# Patient Record
Sex: Male | Born: 1985
Health system: Southern US, Community
[De-identification: ages and names within clinical notes are randomized; demographics above are authoritative.]

## PROBLEM LIST (undated history)

## (undated) DIAGNOSIS — I10 Essential (primary) hypertension: Secondary | ICD-10-CM

## (undated) DIAGNOSIS — R400 Somnolence: Secondary | ICD-10-CM

## (undated) DIAGNOSIS — G4730 Sleep apnea, unspecified: Secondary | ICD-10-CM

## (undated) DIAGNOSIS — F419 Anxiety disorder, unspecified: Secondary | ICD-10-CM

## (undated) DIAGNOSIS — H9192 Unspecified hearing loss, left ear: Secondary | ICD-10-CM

## (undated) HISTORY — PX: TONSILLECTOMY: SUR1361

## (undated) HISTORY — DX: Sleep apnea, unspecified: G47.30

## (undated) HISTORY — DX: Unspecified hearing loss, left ear: H91.92

## (undated) HISTORY — DX: Somnolence: R40.0

## (undated) HISTORY — DX: Anxiety disorder, unspecified: F41.9

## (undated) HISTORY — DX: Essential (primary) hypertension: I10

---

## 2016-03-29 DIAGNOSIS — Z23 Encounter for immunization: Secondary | ICD-10-CM | POA: Diagnosis not present

## 2016-04-08 DIAGNOSIS — F1520 Other stimulant dependence, uncomplicated: Secondary | ICD-10-CM | POA: Diagnosis not present

## 2016-04-08 DIAGNOSIS — Z23 Encounter for immunization: Secondary | ICD-10-CM | POA: Diagnosis not present

## 2016-04-08 DIAGNOSIS — R5383 Other fatigue: Secondary | ICD-10-CM | POA: Diagnosis not present

## 2016-04-08 DIAGNOSIS — I10 Essential (primary) hypertension: Secondary | ICD-10-CM | POA: Diagnosis not present

## 2016-04-08 DIAGNOSIS — G4710 Hypersomnia, unspecified: Secondary | ICD-10-CM | POA: Diagnosis not present

## 2016-04-12 MED FILL — LISINOPRIL 5 MG TABLET: 5 | 90 days supply | Qty: 90 | Fill #0

## 2016-05-21 DIAGNOSIS — I10 Essential (primary) hypertension: Secondary | ICD-10-CM | POA: Diagnosis not present

## 2016-05-21 DIAGNOSIS — E782 Mixed hyperlipidemia: Secondary | ICD-10-CM | POA: Diagnosis not present

## 2016-06-02 DIAGNOSIS — Z23 Encounter for immunization: Secondary | ICD-10-CM | POA: Diagnosis not present

## 2016-06-02 DIAGNOSIS — Z6834 Body mass index (BMI) 34.0-34.9, adult: Secondary | ICD-10-CM | POA: Diagnosis not present

## 2016-06-02 DIAGNOSIS — E782 Mixed hyperlipidemia: Secondary | ICD-10-CM | POA: Diagnosis not present

## 2016-06-02 DIAGNOSIS — I10 Essential (primary) hypertension: Secondary | ICD-10-CM | POA: Diagnosis not present

## 2016-06-02 DIAGNOSIS — E663 Overweight: Secondary | ICD-10-CM | POA: Diagnosis not present

## 2016-06-07 MED FILL — LISINOPRIL 5 MG TABLET: 5 | 30 days supply | Qty: 90 | Fill #0

## 2016-07-01 DIAGNOSIS — G4726 Circadian rhythm sleep disorder, shift work type: Secondary | ICD-10-CM | POA: Diagnosis not present

## 2016-07-01 DIAGNOSIS — Z0000 Encounter for general adult medical examination without abnormal findings: Secondary | ICD-10-CM | POA: Diagnosis not present

## 2016-07-01 DIAGNOSIS — I10 Essential (primary) hypertension: Secondary | ICD-10-CM | POA: Diagnosis not present

## 2016-07-01 MED FILL — MODAFINIL 200 MG TABLET: 200 | 30 days supply | Qty: 30 | Fill #0

## 2016-07-06 MED FILL — LISINOPRIL 5 MG TABLET: 5 | 30 days supply | Qty: 90 | Fill #1

## 2016-07-29 MED FILL — MODAFINIL 200 MG TABLET: 200 | 30 days supply | Qty: 30 | Fill #0

## 2016-08-06 MED FILL — LISINOPRIL 5 MG TABLET: 5 | 30 days supply | Qty: 90 | Fill #2

## 2016-08-20 DIAGNOSIS — Z7189 Other specified counseling: Secondary | ICD-10-CM | POA: Diagnosis not present

## 2016-08-20 DIAGNOSIS — H938X2 Other specified disorders of left ear: Secondary | ICD-10-CM | POA: Diagnosis not present

## 2016-08-20 MED FILL — DOXYCYCLINE HYCLATE 100 MG: 100 | 14 days supply | Qty: 14 | Fill #0

## 2016-08-20 MED FILL — predniSONE 10 MG TABS: 10 | 7 days supply | Qty: 28 | Fill #0

## 2016-09-07 MED FILL — MODAFINIL 200 MG TABLET: 200 | 30 days supply | Qty: 30 | Fill #1

## 2016-09-07 MED FILL — LISINOPRIL 5 MG TABLET: 5 | 30 days supply | Qty: 90 | Fill #3

## 2016-10-06 DIAGNOSIS — H9042 Sensorineural hearing loss, unilateral, left ear, with unrestricted hearing on the contralateral side: Secondary | ICD-10-CM | POA: Diagnosis not present

## 2016-10-06 DIAGNOSIS — H903 Sensorineural hearing loss, bilateral: Secondary | ICD-10-CM | POA: Insufficient documentation

## 2016-10-06 DIAGNOSIS — H9312 Tinnitus, left ear: Secondary | ICD-10-CM | POA: Diagnosis not present

## 2016-10-06 DIAGNOSIS — H9313 Tinnitus, bilateral: Secondary | ICD-10-CM | POA: Insufficient documentation

## 2016-10-11 MED FILL — LISINOPRIL 10 MG TABLET: 10 | 90 days supply | Qty: 135 | Fill #0

## 2016-10-12 MED FILL — MODAFINIL 200 MG TAB: 200 | 30 days supply | Qty: 30 | Fill #2

## 2016-10-20 DIAGNOSIS — Z0000 Encounter for general adult medical examination without abnormal findings: Secondary | ICD-10-CM | POA: Diagnosis not present

## 2016-10-20 DIAGNOSIS — I10 Essential (primary) hypertension: Secondary | ICD-10-CM | POA: Diagnosis not present

## 2016-10-27 DIAGNOSIS — M40292 Other kyphosis, cervical region: Secondary | ICD-10-CM | POA: Diagnosis not present

## 2016-10-27 DIAGNOSIS — E559 Vitamin D deficiency, unspecified: Secondary | ICD-10-CM | POA: Diagnosis not present

## 2016-10-27 DIAGNOSIS — M40209 Unspecified kyphosis, site unspecified: Secondary | ICD-10-CM | POA: Diagnosis not present

## 2016-10-27 DIAGNOSIS — M40294 Other kyphosis, thoracic region: Secondary | ICD-10-CM | POA: Diagnosis not present

## 2016-10-27 DIAGNOSIS — Z0000 Encounter for general adult medical examination without abnormal findings: Secondary | ICD-10-CM | POA: Diagnosis not present

## 2016-11-09 DIAGNOSIS — M545 Low back pain: Secondary | ICD-10-CM | POA: Diagnosis not present

## 2016-11-17 MED FILL — MODAFINIL 200 MG TAB: 200 | 30 days supply | Qty: 30 | Fill #3

## 2016-11-30 DIAGNOSIS — H9042 Sensorineural hearing loss, unilateral, left ear, with unrestricted hearing on the contralateral side: Secondary | ICD-10-CM | POA: Diagnosis not present

## 2016-12-31 MED FILL — ARMODAFINIL 150 MG TABLET: 150 | 30 days supply | Qty: 30 | Fill #0

## 2017-01-11 MED FILL — LISINOPRIL 10 MG TABLET: 10 | 90 days supply | Qty: 135 | Fill #1

## 2017-01-26 DIAGNOSIS — E559 Vitamin D deficiency, unspecified: Secondary | ICD-10-CM | POA: Diagnosis not present

## 2017-01-31 MED FILL — ARMODAFINIL 150 MG TABLET: 150 | 30 days supply | Qty: 30 | Fill #1

## 2017-03-07 MED FILL — ARMODAFINIL 150 MG TABLET: 150 | 30 days supply | Qty: 30 | Fill #2

## 2017-04-08 MED FILL — ARMODAFINIL 150 MG TABLET: 150 | 30 days supply | Qty: 30 | Fill #3

## 2017-04-20 MED FILL — LISINOPRIL 10 MG TABS: 10 | 90 days supply | Qty: 135 | Fill #0

## 2017-05-12 MED FILL — ARMODAFINIL 150 MG TABLET: 150 | 30 days supply | Qty: 30 | Fill #0

## 2017-05-20 DIAGNOSIS — F10129 Alcohol abuse with intoxication, unspecified: Secondary | ICD-10-CM | POA: Diagnosis not present

## 2017-05-20 DIAGNOSIS — R4182 Altered mental status, unspecified: Secondary | ICD-10-CM | POA: Diagnosis present

## 2017-05-20 DIAGNOSIS — F10120 Alcohol abuse with intoxication, uncomplicated: Secondary | ICD-10-CM | POA: Insufficient documentation

## 2017-05-20 DIAGNOSIS — R740 Nonspecific elevation of levels of transaminase and lactic acid dehydrogenase [LDH]: Secondary | ICD-10-CM | POA: Diagnosis not present

## 2017-05-20 DIAGNOSIS — Z79899 Other long term (current) drug therapy: Secondary | ICD-10-CM | POA: Insufficient documentation

## 2017-05-20 DIAGNOSIS — R1110 Vomiting, unspecified: Secondary | ICD-10-CM | POA: Diagnosis not present

## 2017-05-20 MED ORDER — SODIUM CHLORIDE 0.9 % IV BOLUS (SEPSIS)
1000.0000 mL | Freq: Once | INTRAVENOUS | Status: AC
  Administered 2017-05-21: 1000 mL via INTRAVENOUS

## 2017-05-21 ENCOUNTER — Encounter (HOSPITAL_COMMUNITY): Payer: Self-pay | Admitting: Emergency Medicine

## 2017-05-21 ENCOUNTER — Other Ambulatory Visit: Payer: Self-pay

## 2017-05-21 ENCOUNTER — Emergency Department (HOSPITAL_COMMUNITY)
Admission: EM | Admit: 2017-05-21 | Discharge: 2017-05-21 | Disposition: A | Discharge status: Home / Self Care | Payer: 59 | Attending: Emergency Medicine | Admitting: Emergency Medicine

## 2017-05-21 ENCOUNTER — Emergency Department (HOSPITAL_COMMUNITY): Payer: 59

## 2017-05-21 DIAGNOSIS — F10920 Alcohol use, unspecified with intoxication, uncomplicated: Secondary | ICD-10-CM

## 2017-05-21 DIAGNOSIS — Z79899 Other long term (current) drug therapy: Secondary | ICD-10-CM | POA: Diagnosis not present

## 2017-05-21 DIAGNOSIS — R740 Nonspecific elevation of levels of transaminase and lactic acid dehydrogenase [LDH]: Secondary | ICD-10-CM

## 2017-05-21 DIAGNOSIS — R7989 Other specified abnormal findings of blood chemistry: Secondary | ICD-10-CM | POA: Diagnosis not present

## 2017-05-21 DIAGNOSIS — F10120 Alcohol abuse with intoxication, uncomplicated: Secondary | ICD-10-CM | POA: Diagnosis not present

## 2017-05-21 DIAGNOSIS — R7401 Elevation of levels of liver transaminase levels: Secondary | ICD-10-CM

## 2017-05-21 LAB — COMPREHENSIVE METABOLIC PANEL
ALK PHOS: 39 U/L (ref 38–126)
ALT: 90 U/L — ABNORMAL HIGH (ref 17–63)
ANION GAP: 13 (ref 5–15)
AST: 282 U/L — ABNORMAL HIGH (ref 15–41)
Albumin: 4.7 g/dL (ref 3.5–5.0)
BUN: 14 mg/dL (ref 6–20)
CALCIUM: 9 mg/dL (ref 8.9–10.3)
CO2: 19 mmol/L — AB (ref 22–32)
Chloride: 106 mmol/L (ref 101–111)
Creatinine, Ser: 0.92 mg/dL (ref 0.61–1.24)
GFR calc non Af Amer: >60 mL/min (ref >60)
Glucose, Bld: 121 mg/dL — ABNORMAL HIGH (ref 65–99)
Potassium: 3.2 mmol/L — ABNORMAL LOW (ref 3.5–5.1)
SODIUM: 138 mmol/L (ref 135–145)
Total Bilirubin: 0.5 mg/dL (ref 0.3–1.2)
Total Protein: 7.5 g/dL (ref 6.5–8.1)

## 2017-05-21 LAB — CBG MONITORING, ED: GLUCOSE-CAPILLARY: 114 mg/dL — AB (ref 65–99)

## 2017-05-21 LAB — CBC
HCT: 44.6 % (ref 39.0–52.0)
HEMOGLOBIN: 15.5 g/dL (ref 13.0–17.0)
MCH: 29.7 pg (ref 26.0–34.0)
MCHC: 34.8 g/dL (ref 30.0–36.0)
MCV: 85.4 fL (ref 78.0–100.0)
Platelets: 370 10*3/uL (ref 150–400)
RBC: 5.22 MIL/uL (ref 4.22–5.81)
RDW: 12.5 % (ref 11.5–15.5)
WBC: 17.6 10*3/uL — ABNORMAL HIGH (ref 4.0–10.5)

## 2017-05-21 LAB — LIPASE, BLOOD: Lipase: 35 U/L (ref 11–51)

## 2017-05-21 LAB — ETHANOL: ALCOHOL ETHYL (B): 216 mg/dL — AB (ref <10)

## 2017-05-21 MED ORDER — NAPROXEN 500 MG PO TABS
500.0000 mg | ORAL_TABLET | Freq: Once | ORAL | Status: AC
  Administered 2017-05-21: 500 mg via ORAL
  Filled 2017-05-21: qty 1

## 2017-05-21 MED ORDER — SODIUM CHLORIDE 0.9 % IV BOLUS (SEPSIS)
1000.0000 mL | Freq: Once | INTRAVENOUS | Status: AC
  Administered 2017-05-21: 1000 mL via INTRAVENOUS

## 2017-05-21 MED ORDER — POTASSIUM CHLORIDE 10 MEQ/100ML IV SOLN: 10.0000 meq | INTRAVENOUS | Status: AC

## 2017-05-21 MED ORDER — PROMETHAZINE HCL 25 MG/ML IJ SOLN
25.0000 mg | Freq: Once | INTRAMUSCULAR | Status: AC
  Administered 2017-05-21: 25 mg via INTRAVENOUS
  Filled 2017-05-21: qty 1

## 2017-05-21 MED ORDER — ONDANSETRON HCL 4 MG/2ML IJ SOLN
4.0000 mg | Freq: Once | INTRAMUSCULAR | Status: AC
  Administered 2017-05-21: 4 mg via INTRAVENOUS
  Filled 2017-05-21: qty 2

## 2017-05-21 NOTE — ED Notes (Signed)
Bed: WTR9 Expected date:  Expected time:  Means of arrival:  Comments: 

## 2017-05-21 NOTE — ED Notes (Signed)
Bed: [facility]ALG Expected date:  Expected time:  Means of arrival:  Comments: 

## 2017-05-21 NOTE — Discharge Instructions (Signed)
1. Medications: usual home medications 2. Treatment: rest, drink plenty of fluids,  3. Follow Up: Please followup with your primary doctor in 2-3 days for discussion of your diagnoses and further evaluation after today's visit; if you do not have a primary care doctor use the resource guide provided to find one; Please return to the ER for abdominal pain, vomiting, jaundice or other concerns

## 2017-05-21 NOTE — ED Notes (Signed)
Removed PIV #20 from right hand. 

## 2017-05-21 NOTE — ED Provider Notes (Signed)
[facility]-EMERGENCY DEPT Provider Note   CSN: [id number] Arrival date & time: 05/20/17  2350     History   Chief Complaint Chief Complaint  Patient presents with  . Alcohol Intoxication    HPI Jay Ortiz is a 32 y.o. male with a hx of hard of hearing presents to the Emergency Department after being found in the bathroom at a party.  Friend at bedside reports patient had large amount of alcohol to drink tonight.  Denies any known drug use.  Level 5 caveat for altered mental status.  The history is provided by medical records and a friend. No language interpreter was used.    History reviewed. No pertinent past medical history.  There are no active problems to display for this patient.     Home Medications    Prior to Admission medications   Medication Sig Start Date End Date Taking? Authorizing Provider  Armodafinil 150 MG tablet Take 150 mg by mouth daily.   Yes [provider]  lisinopril (PRINIVIL,ZESTRIL) 10 MG tablet Take 15 mg by mouth daily.   Yes [provider]  Melatonin 10 MG TBCR Take 10 mg by mouth at bedtime.   Yes [provider]    Family History History reviewed. No pertinent family history.  Social History Social History   Tobacco Use  . Smoking status: Not on file  Substance Use Topics  . Alcohol use: Not on file  . Drug use: Not on file     Allergies   Patient has no known allergies.   Review of Systems Review of Systems  Unable to perform ROS: Mental status change  Gastrointestinal: Positive for vomiting.     Physical Exam Updated Vital Signs BP 128/74 (BP Location: Right Arm)   Pulse 74   Resp 16   SpO2 96%   Physical Exam  Constitutional: He appears well-developed and well-nourished. He appears lethargic. No distress.  Allergic.  He groans to sternal rub but does not open his eyes.  He does not follow commands.  Patient intermittently vomiting.  He is covered in emesis  and has urinated on himself.  HENT:  Head: Normocephalic and atraumatic.  No contusions or evidence of trauma  Eyes: Conjunctivae are normal. Pupils are equal, round, and reactive to light. No scleral icterus.  Neck: Normal range of motion.  Moves neck freely  Cardiovascular: Normal rate and intact distal pulses.  Pulses:      Radial pulses are 2+ on the right side, and 2+ on the left side.       Dorsalis pedis pulses are 2+ on the right side, and 2+ on the left side.  Pulmonary/Chest: Effort normal and breath sounds normal.  Clear and equal breath sounds  Abdominal: Soft. Bowel sounds are normal. He exhibits no distension.  Soft.  No distention  Musculoskeletal: Normal range of motion.  Neurological: He appears lethargic. GCS eye subscore is 1. GCS verbal subscore is 3. GCS motor subscore is 4.  Skin: Skin is warm. He is diaphoretic. There is pallor.  Nursing note and vitals reviewed.    ED Treatments / Results  Labs (all labs ordered are listed, but only abnormal results are displayed) Labs Reviewed  CBC - Abnormal; Notable for the following components:      Result Value   WBC 17.6 (*)    All other components within normal limits  COMPREHENSIVE METABOLIC PANEL - Abnormal; Notable for the following components:   Potassium 3.2 (*)  CO2 19 (*)    Glucose, Bld 121 (*)    AST 282 (*)    ALT 90 (*)    All other components within normal limits  ETHANOL - Abnormal; Notable for the following components:   Alcohol, Ethyl (B) 216 (*)    All other components within normal limits  CBG MONITORING, ED - Abnormal; Notable for the following components:   Glucose-Capillary 114 (*)    All other components within normal limits  LIPASE, BLOOD    Radiology US Abdomen Complete  Result Date: 05/21/2017 CLINICAL DATA:  Elevated liver enzymes.  Vomiting. EXAM: ABDOMEN ULTRASOUND COMPLETE COMPARISON:  None. FINDINGS: The examination is markedly limited by the patient's body habitus and the  amount of bowel gas. Gallbladder: No gallstones or wall thickening visualized. No sonographic Murphy sign noted by sonographer. Common bile duct: Not visualized due to bowel gas Liver: Poor visualization due to body habitus. Portal vein is patent on color Doppler imaging with normal direction of blood flow towards the liver. IVC: Not visualized due to bowel gas/body habitus. Pancreas: Not visualized due to bowel gas/body habitus. Spleen: Size and appearance within normal limits. Right Kidney: Length: 9.1 cm. Poorly visualized due to bowel gas/body habitus. Left Kidney: Length: 9.5 cm. Poorly visualized due to bowel gas/body habitus. Abdominal aorta: No aneurysm visualized. Other findings: None. IMPRESSION: 1. Markedly limited examination due to patient body habitus, the amount of bowel gas and reported patient intoxication. 2. No visualized acute abnormality within the above described limitations. Electronically Signed   By: Deatra Robinson M.D.   On: 05/21/2017 05:02    Procedures Procedures (including critical care time)  Medications Ordered in ED Medications  ondansetron (ZOFRAN) injection 4 mg (not administered)  potassium chloride 10 mEq in 100 mL IVPB (10 mEq Intravenous Refused 05/21/17 0606)  naproxen (NAPROSYN) tablet 500 mg (not administered)  sodium chloride 0.9 % bolus 1,000 mL (0 mLs Intravenous Stopped 05/21/17 0606)  promethazine (PHENERGAN) injection 25 mg (25 mg Intravenous Given 05/21/17 0125)  sodium chloride 0.9 % bolus 1,000 mL (1,000 mLs Intravenous New Bag/Given 05/21/17 0606)     Initial Impression / Assessment and Plan / ED Course  I have reviewed the triage vital signs and the nursing notes.  Pertinent labs & imaging results that were available during my care of the patient were reviewed by me and considered in my medical decision making (see chart for details).     Presents with altered mental status after significant alcohol intoxication.  He has hypoxic on room air  requiring supplemental oxygen.  Numerous bouts of emesis here in the emergency department.  Labs with leukocytosis and transaminitis.  Patient is afebrile and abdomen is soft.  6:03 AM Patient is now alert and oriented.  He reports drinking both liquor and wine last night.  He has no history of transaminitis or gallbladder problems.  Patient reports he drinks intermittently but is not a daily drinker.  He reports he has a generalized headache at this time, but does not have any abdominal pain.  He denies recent illness.    On repeat exam his abdomen is soft and nontender.  Patient given a second liter of fluid and naproxen for his headache.  Will discharge to home.  Discussed importance of close primary care follow-up for repeat lab work within the next several days.  Patient states understanding and is in agreement with this plan.  Final Clinical Impressions(s) / ED Diagnoses   Final diagnoses:  Alcoholic intoxication without  complication [facility])  Transaminitis    ED Discharge Orders    None       Mardene Sayer Boyd Kerbs 05/21/17 1610    Dione Booze, MD 05/21/17 [phone]

## 2017-06-15 MED FILL — ARMODAFINIL 150 MG TABLET: 150 | 30 days supply | Qty: 30 | Fill #1

## 2017-07-11 ENCOUNTER — Encounter: Payer: Self-pay | Admitting: Neurology

## 2017-07-12 ENCOUNTER — Encounter: Payer: Self-pay | Admitting: Neurology

## 2017-07-12 ENCOUNTER — Ambulatory Visit (INDEPENDENT_AMBULATORY_CARE_PROVIDER_SITE_OTHER): Payer: 59 | Admitting: Neurology

## 2017-07-12 VITALS — BP 140/81 | HR 74 | Ht 68.0 in | Wt 228.0 lb

## 2017-07-12 DIAGNOSIS — G4713 Recurrent hypersomnia: Secondary | ICD-10-CM

## 2017-07-12 DIAGNOSIS — E669 Obesity, unspecified: Secondary | ICD-10-CM | POA: Insufficient documentation

## 2017-07-12 DIAGNOSIS — G4726 Circadian rhythm sleep disorder, shift work type: Secondary | ICD-10-CM

## 2017-07-12 HISTORY — DX: Recurrent hypersomnia: G47.13

## 2017-07-12 HISTORY — DX: Circadian rhythm sleep disorder, shift work type: G47.26

## 2017-07-12 MED ORDER — ARMODAFINIL 250 MG PO TABS: 250.0000 mg | ORAL_TABLET | Freq: Every day | ORAL | 5 refills | Status: DC

## 2017-07-12 MED FILL — ARMODAFINIL 250 MG TABLET: 250 | 30 days supply | Qty: 30 | Fill #0

## 2017-07-12 NOTE — Patient Instructions (Signed)
Armodafinil tablets What is this medicine? ARMODAFINIL (ar moe DAF i nil) is used to treat excessive sleepiness caused by certain sleep disorders. This includes narcolepsy, sleep apnea, and shift work sleep disorder. This medicine may be used for other purposes; ask your health care provider or pharmacist if you have questions. COMMON BRAND NAME(S): Nuvigil What should I tell my health care provider before I take this medicine? They need to know if you have any of these conditions: -bipolar disorder -depression -drug or alcohol abuse or addiction -heart disease -high blood pressure -kidney disease -liver disease -schizophrenia -suicidal thoughts, plans, or attempt; a previous suicide attempt by you or a family member -an unusual or allergic reaction to armodafinil, modafinil, medicines, foods, dyes, or preservatives -pregnant or trying to get pregnant -breast-feeding How should I use this medicine? Take this medicine by mouth with a glass of water. Follow the directions on the prescription label. Take your doses at regular intervals. Do not take your medicine more often than directed. Do not stop taking this medicine suddenly except upon the advice of your doctor. Stopping this medicine too quickly may cause serious side effects or your condition may worsen. A special MedGuide will be given to you by the pharmacist with each prescription and refill. Be sure to read this information carefully each time. Talk to your pediatrician regarding the use of this medicine in children. While this drug may be prescribed for children as young as 17 years of age for selected conditions, precautions do apply. Overdosage: If you think you have taken too much of this medicine contact a poison control center or emergency room at once. NOTE: This medicine is only for you. Do not share this medicine with others. What if I miss a dose? If you miss a dose, take it as soon as you can. If it is almost time for  your next dose, take only that dose. Do not take double or extra doses. What may interact with this medicine? Do not take this medicine with any of the following medications: -amphetamine or dextroamphetamine -dexmethylphenidate or methylphenidate -MAOIs like Carbex, Eldepryl, Marplan, Nardil, and Parnate -pemoline -procarbazine This medicine may also interact with the following medications: -antifungal medicines like itraconazole or ketoconazole -barbiturates, like phenobarbital -birth control pills or other hormone-containing birth control devices or implants -carbamazepine -cyclosporine -diazepam -medicines for depression, anxiety, or psychotic disturbances -phenytoin -propranolol -triazolam -warfarin This list may not describe all possible interactions. Give your health care provider a list of all the medicines, herbs, non-prescription drugs, or dietary supplements you use. Also tell them if you smoke, drink alcohol, or use illegal drugs. Some items may interact with your medicine. What should I watch for while using this medicine? Visit your doctor or health care professional for regular checks on your progress. The full effect of this medicine may not be seen right away. This medicine may affect your concentration, function, or may hide signs that you are tired. You may get dizzy. This medicine will not eliminate your abnormal tendency to fall asleep and is not a replacement for sleep. Do not change your previous behavior regarding potentially dangerous activities, such as driving, using machinery, or doing anything that needs mental alertness until you know how this drug affects you. Alcohol can make you more dizzy and may interfere with your response to this medicine or your alertness. Avoid alcoholic drinks. Birth control pills may not work properly while you are taking this medicine. While using birth control pills, you will need an   additional barrier method or an alternative  non-hormonal method of birth control during treatment with armodafinil and for 1 month after stopping armodafinil. Talk to your doctor about which extra method of birth control is right for you. It is unknown if the effects of this medicine will be increased by the use of caffeine. Caffeine is found in many foods, beverages, and medications. Ask your doctor if you should limit or change your intake of caffeine-containing products while on this medicine. Do not stop previously prescribed treatments for your condition, such as a CPAP machine, except on the advise of your physician or health care professional. What side effects may I notice from receiving this medicine? Side effects that you should report to your doctor or health care professional as soon as possible: -allergic reactions like skin rash, itching or hives, swelling of the face, lips, or tongue -anxiety -breathing or swallowing problems -chest pain -depressed mood -elevated mood, decreased need for sleep, racing thoughts, impulsive behavior -fast, irregular heartbeat -fever with rash, swollen lymph nodes, or swelling of the face -hallucination, loss of contact with reality -increased blood pressure -mouth sores, blisters, or peeling skin -sore throat, fever, or chills -suicidal thoughts or other mood changes -tremors -vomiting Side effects that usually do not require medical attention (report to your doctor or health care professional if they continue or are bothersome): -headache -nausea, diarrhea, or stomach upset -nervousness -trouble sleeping This list may not describe all possible side effects. Call your doctor for medical advice about side effects. You may report side effects to FDA at 1-800-FDA-1088. Where should I keep my medicine? Keep out of the reach of children. Store at room temperature between 20 and 25 degrees C (68 and 77 degrees F). Throw away any unused medicine after the expiration date. NOTE: This sheet is  a summary. It may not cover all possible information. If you have questions about this medicine, talk to your doctor, pharmacist, or health care provider.  2018 Elsevier/Gold Standard (2015-09-26 18:16:30)  

## 2017-07-12 NOTE — Progress Notes (Signed)
SLEEP MEDICINE CLINIC   Provider:  Melvyn Novas, M D  Primary Care Physician:  Pearson Grippe, MD   Referring Provider: Pearson Grippe, MD    Chief Complaint  Patient presents with  . New Patient (Initial Visit)    rm 11, pt alone, he has alot of daytime sleepiness. he went to PCP and recommendations were made, tried provigil didnt work and then currently is on nuvigil and he said it worked for a little bit but has started to not notice it working currently. pt doesnt snore or stop breathing in sleep.     HPI:  Jay Ortiz is a 32 y.o. male , seen here  in a referral from Dr. Selena Batten for an evaluation of excessive daytime sleepiness and non-restorative sleep.   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    Sleep habits are as follows: Jay Ortiz prefers to go to bed at 8 PM and he can usually sleep through the night when he is not on third shift.  He will fall asleep easily at that time and then rises in the morning with an alarm at 8 AM.  His sleep quality for nighttime sleep is good he does not believe that he snores and he has not been told that he has apnea. His bedroom at home is cool quiet and dark and during the time that he works third shift he is undisturbed.  He sleeps mainly on his side, but he does turn and change sleep position , he uses 2 pillows. He will go home after a night shift which concludes about 7 AM and reaches home at 8, usually he will try to sleep as early as possible, and in daytime is able to sleep for about 5:30 PM, waking again with alarm. He has worked third shift for a long time even while attending nursing school, but he does feel that now that he is enrolled in daytime classes and continues to work nights he has been more excessively daytime sleepy, and he feels sleep  deprived. He is enrolled in the graduate school since autumn of 2017. Dr Selena Batten started modafinil.   Sleep medical history and family sleep history: father is a snorer, older sister is healthy, mother is healthy. Tonsillectomy at age 48 , no History of neck injury, ENT surgery, TBI.   Social history: single, no children, grew up in [place],  Non smoker, non or rarely drinking alcohol- and cut down on caffeine - 1 cup in AM and one at [employer]. He used to drink coffee all day and at night during shift. he is enrolled in daytime classes and continues to work nights he has been more excessively daytime sleepy, and he feels sleep deprived. He is enrolled in the graduate school since autumn of 2017.    Review of Systems:no snoring, no nocturia, no RLS,  Out of a complete 14 system review, the patient complains of only the following symptoms, and all other reviewed systems are negative.  Epworth score 17/ 24  , Fatigue severity score 17 / 63 ,  Hearing loss with tinnitus left ear- he has a hearing aid and is not using it today.  depression score n/a ,   No flowsheet data  found.     Social History   Socioeconomic History  . Marital status: Single    Spouse name: Not on file  . Number of children: Not on file  . Years of education: Not on file  . Highest education level: Not on file  Social Needs  . Financial resource strain: Not on file  . Food insecurity - worry: Not on file  . Food insecurity - inability: Not on file  . Transportation needs - medical: Not on file  . Transportation needs - non-medical: Not on file  Occupational History  . Not on file  Tobacco Use  . Smoking status: Never Smoker  . Smokeless tobacco: Never Used  Substance and Sexual Activity  . Alcohol use: Yes    Frequency: Never    Comment: socially  . Drug use: No  . Sexual activity: Not on file  Other Topics Concern  . Not on file  Social History Narrative  . Not on file    Family History  Problem  Relation Age of Onset  . Ulcers Mother   . Hypertension Father   . Hyperlipidemia Father   . CVA Maternal Grandmother   . Diabetes Mellitus II Maternal Grandfather   . Colon cancer Paternal Grandmother   . Heart attack Paternal Grandfather     Past Medical History:  Diagnosis Date  . Daytime sleepiness   . 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 Hypertension     Current Outpatient Medications  Medication Sig Dispense Refill  . Armodafinil 150 MG tablet Take 150 mg by mouth daily.    . fexofenadine (ALLEGRA) 60 MG tablet Take 60 mg by mouth as needed for allergies or rhinitis.    Marland Kitchen lisinopril (PRINIVIL,ZESTRIL) 10 MG tablet Take 15 mg by mouth daily.     No current facility-administered medications for this visit.     Allergies as of 07/12/2017  . (No Known Allergies)    Vitals: BP 140/81   Pulse 74   Ht 5\' 8"  (1.727 m)   Wt 228 lb (103.4 kg)   BMI 34.67 kg/m  Last Weight:  Wt Readings from Last 1 Encounters:  07/12/17 228 lb (103.4 kg)   HYQ:MVHQ mass index is 34.67 kg/m.     Last Height:   Ht Readings from Last 1 Encounters:  07/12/17 5\' 8"  (1.727 m)    Physical exam:  General: The patient is awake, alert and appears not in acute distress. The patient is well groomed. Head: Normocephalic, atraumatic. Neck is supple. Mallampati 3, tooth gap  neck circumference: 15.5 . Nasal airflow patent,  Retrognathia is not seen. He wore braces in middle school and later a retainer.  Cardiovascular:  Regular rate and rhythm, without  murmurs or carotid bruit, and without distended neck veins. Respiratory: Lungs are clear to auscultation. Skin:  Without evidence of edema, or rash Trunk: BMI is 35. The patient's posture is erect.  Neurologic exam : The patient is awake and alert, oriented to place and time.   Memory subjective described as intact.  Attention span & concentration ability appears normal.  Speech is fluent,  without dysarthria, dysphonia or  aphasia.  Mood and affect are appropriate.  Cranial nerves: Pupils are equal and briskly reactive to light. Funduscopic exam without evidence of pallor or edema. Extraocular movements  in vertical and horizontal planes intact and without nystagmus. Visual fields by finger perimetry are intact.Hearing to finger rub intact.  Facial sensation intact to fine touch.  Facial motor strength is symmetric and tongue and uvula move midline. Shoulder shrug was symmetrical.  Motor exam:   Normal tone, muscle bulk and symmetric strength in all extremities. Sensory:  Fine touch, pinprick and vibration were tested in all extremities. Proprioception tested in the upper extremities was normal. Coordination: Rapid alternating movements in the fingers/hands was normal. Finger-to-nose maneuver  normal without evidence of ataxia, dysmetria or tremor. Gait and station: Patient walks without assistive device - Strength within normal limits.Stance is stable and of normal base. Tandem gait is unfragmented. Turns with 3 Steps. Romberg testing is negative. Deep tendon reflexes: in the  upper and lower extremities are symmetric and intact.   Assessment:  After physical and neurologic examination, review of laboratory studies,  Personal review of  results of testing and pre-existing records as far as provided in visit., my assessment is :  Jay Ortiz has stepfather not fallen asleep in class and he denies having the irresistible urge to sleep but he endorses a higher degree of daytime sleepiness than most.  This can be completely related to his irregular sleep hours, determined by work and school attendance.  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  Modafinil have been prescribed to help him stay alert  during shifts and also make it safe for him to return home, but he felt that it has been minimal effect on him.  He is actually not unhappy with the quality of his sleep but he seems not to get enough sleep when he needs it. He will graduate in May 2020 so he will need some help to bridge over for the next 13 or 14 months.  I will offer him a home sleep test to make sure there is no physiologic sleep disorder present,  And then we will discussed how to keep him alert and awake in daytime since modafinil seems not to work well for him.  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    The patient was advised of the nature of the diagnosed disorder , the treatment options and the  risks for general health and wellness arising from not treating the condition. EDS is not narcolepsy related, no cataplexy, no sleep paralysis, no dream intrusion.   I spent more than 45 minutes of face to face time with the patient.  Greater than 50% of time was spent in counseling and coordination of care. We have discussed the diagnosis and differential and I answered the patient's questions.    Plan:  Treatment plan and additional workup :  1)  HST, and higher dose of Nuvigil- he had only tried 150 mg and by BMI and age should be able to get 250 mg, hopefully with better control.  He needs a regimented sleep schedule - that will be his solution.  I gave him sleep hygiene instructions- and urged him to reduce his night shifts as much as possible.  He may be able to pick up shifts in summer and spring break allowing him to reduce the double duty during the semester.    Melvyn Novas, MD 07/12/2017, 9:07 AM  Certified in  Neurology by ABPN Certified in Sleep Medicine by [facility]  [facility] [address], [address] [place], [place] [address]

## 2017-08-15 MED FILL — ARMODAFINIL 250 MG TABLET: 250 | 30 days supply | Qty: 30 | Fill #1

## 2017-08-15 MED FILL — LISINOPRIL 10 MG TABLET: 10 | 90 days supply | Qty: 135 | Fill #1

## 2017-08-17 ENCOUNTER — Ambulatory Visit (INDEPENDENT_AMBULATORY_CARE_PROVIDER_SITE_OTHER): Payer: 59 | Admitting: Neurology

## 2017-08-17 DIAGNOSIS — E66811 Obesity, class 1: Secondary | ICD-10-CM

## 2017-08-17 DIAGNOSIS — G4710 Hypersomnia, unspecified: Secondary | ICD-10-CM | POA: Diagnosis not present

## 2017-08-17 DIAGNOSIS — G4713 Recurrent hypersomnia: Secondary | ICD-10-CM

## 2017-08-17 DIAGNOSIS — G4726 Circadian rhythm sleep disorder, shift work type: Secondary | ICD-10-CM

## 2017-08-17 DIAGNOSIS — E669 Obesity, unspecified: Secondary | ICD-10-CM

## 2017-08-17 DIAGNOSIS — G4719 Other hypersomnia: Secondary | ICD-10-CM

## 2017-08-29 NOTE — Addendum Note (Signed)
Addended by: Melvyn Novas on: 08/29/2017 05:13 PM   Modules accepted: Orders

## 2017-08-29 NOTE — Procedures (Signed)
[facility] [address] [place], [place] [address] NAME: Jay Ortiz                                                                     DOB: [birth year]  MEDICAL RECORD No:   [id number]                                                DOS: 08/17/2017 REFERRING PHYSICIAN: Pearson Grippe, M.D.  STUDY PERFORMED: Home Sleep Study HISTORY: [name] is a 32 y.o. male patient, seen here in a referral from Dr. Selena Batten for an evaluation of excessive daytime sleepiness and non-restorative sleep.    Mr. 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 His bedroom at home is cool quiet and dark and during the time that he works third shift he is undisturbed.   He has worked third shift for a long time even while attending nursing school, but he does feel that now that he is enrolled in daytime classes and continues to work nights he has been more excessively daytime sleepy, and he feels sleep deprived.   Epworth score 17/ 24 points, Fatigue severity score 17 / 63 points, BMI: 34.6  STUDY RESULTS:  Total Recording Time: 8 hours, 28 minutes Total Apnea/Hypopnea Index (AHI): 1.6 /h: RDI was 3.3 /h Average Oxygen Saturation:  96 %; Lowest Oxygen Desaturation:  89%  Total Time Oxygen Saturation Below or at 88% was 0.0 minutes  Average Heart Rate:    60 bpm (between 42 and 109 bpm) IMPRESSION: very mild apnea and mild snoring, no hypoxemia RECOMMENDATION: NO CPAP intervention, based on this normal HST a PSG with MSLT is needed to explain the hypersomnia symptoms.  I certify that I have reviewed the raw data recording prior to the issuance of this report in accordance with the standards of the American Academy of Sleep  Medicine (AASM). Melvyn Novas, M.D.   08-29-2017  Medical Director of [facility], accredited by the AASM. Diplomat of the ABPN and ABSM.

## 2017-08-30 ENCOUNTER — Telehealth: Payer: Self-pay | Admitting: Neurology

## 2017-08-30 NOTE — Telephone Encounter (Signed)
Called the pt to review the sleep study results. The pt didn't show signs of apnea. It also showed that he had a good amount of sleep with only mild snoring. Dr Dohmeier is recommending that we proceed forward with a sleep study in lab to assess for narcolepsy. She has ordered a overnight study and MSLT for the pt. The pt takes nuvigil. I have reviewed that in order to have a valid study the patient would need to have stopped the medication 14 days prior to the study. Pt verbalized understanding.Pt had no questions at this time but was encouraged to call back if questions arise.

## 2017-08-30 NOTE — Telephone Encounter (Signed)
-----   Message from Melvyn Novas, MD sent at 08/29/2017  5:13 PM EDT ----- Patients sleepiness is not reflected in sleep apnea per apnea link based HST. Valid study time was ober 8 hours, only mildest apnea was found.  Needs PSG and MSLT to follow.

## 2017-09-07 DIAGNOSIS — K7689 Other specified diseases of liver: Secondary | ICD-10-CM | POA: Diagnosis not present

## 2017-09-13 MED FILL — ARMODAFINIL 250 MG TABLET: 250 | 30 days supply | Qty: 30 | Fill #2

## 2017-10-19 ENCOUNTER — Ambulatory Visit (INDEPENDENT_AMBULATORY_CARE_PROVIDER_SITE_OTHER): Payer: 59 | Admitting: Neurology

## 2017-10-19 DIAGNOSIS — E669 Obesity, unspecified: Secondary | ICD-10-CM

## 2017-10-19 DIAGNOSIS — G4713 Recurrent hypersomnia: Secondary | ICD-10-CM

## 2017-10-19 DIAGNOSIS — G4719 Other hypersomnia: Secondary | ICD-10-CM

## 2017-10-19 DIAGNOSIS — G4710 Hypersomnia, unspecified: Secondary | ICD-10-CM

## 2017-10-19 DIAGNOSIS — G4726 Circadian rhythm sleep disorder, shift work type: Secondary | ICD-10-CM

## 2017-10-21 DIAGNOSIS — E559 Vitamin D deficiency, unspecified: Secondary | ICD-10-CM | POA: Diagnosis not present

## 2017-10-21 DIAGNOSIS — Z0000 Encounter for general adult medical examination without abnormal findings: Secondary | ICD-10-CM | POA: Diagnosis not present

## 2017-10-21 NOTE — Procedures (Signed)
PATIENT'S NAME:  Jay Ortiz, Jay Ortiz DOB:      1986-04-27      MR#:    [id number]     DATE OF RECORDING: 10/19/2017 REFERRING M.D.:  Pearson Grippe, MD Study Performed:   Baseline Polysomnogram HISTORY:  Jay Ortiz is a 32 year old male patient seen here for an evaluation of excessive daytime sleepiness and non-restorative sleep.   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  His sleep quality for nighttime sleep is good, he does not believe that he snores- and he has not been told that he has apnea. 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 He has been more excessively daytime sleepy, and he feels sleep deprived. The patient endorsed the Epworth Sleepiness Scale at 17/24 points.   The patient's weight 227 pounds with a height of 68 (inches), resulting in a BMI of 34.4 kg/m2. The patient's neck circumference measured 15 inches.  CURRENT MEDICATIONS: Armodafinil, Allegra, Lisinopril   PROCEDURE:  This is a multichannel digital polysomnogram utilizing the Somnostar 11.2 system.  Electrodes and sensors were applied and monitored per AASM Specifications.   EEG, EOG, Chin and Limb EMG, were sampled at 200 Hz.  ECG, Snore and Nasal Pressure, Thermal Airflow, Respiratory Effort, CPAP Flow and Pressure, Oximetry was sampled at 50 Hz. Digital video and audio were recorded.      BASELINE STUDY : Lights Out was at 21:15 and Lights On at 05:59.  Total recording time (TRT) was 525 minutes, with a total sleep time (TST) of 466.5 minutes. The patient's sleep latency was 37.5 minutes.  REM latency was 122 minutes. The sleep efficiency was 88.9 %.     SLEEP ARCHITECTURE: WASO (Wake after sleep onset) was 25.5 minutes.  There were 41.5 minutes in Stage N1, 206 minutes Stage N2, 119.5 minutes Stage N3  and 99.5 minutes in Stage REM.  The percentage of Stage N1 was 8.9%, Stage N2 was 44.2%, Stage N3 was 25.6% and Stage R (REM sleep) was 21.3%.   The arousals were noted as: 106 were spontaneous, 0 were associated with PLMs, and 3 were associated with respiratory events.  RESPIRATORY ANALYSIS:  There were a total of 40 respiratory events:  5 mixed apneas with a total of 5 apneas and an apnea index (AI) of 0.6 /hour. There were 35 hypopneas with a hypopnea index of 4.5 /hour. The patient also had 0 respiratory event related arousals (RERAs). The total APNEA/HYPOPNEA INDEX (AHI) was 5.1 /hour and the total RESPIRATORY DISTURBANCE INDEX was 5.1 /hour.  38 events occurred in REM sleep and 4 events in NREM. The REM AHI was 22.9 /hour, versus a non-REM AHI of 0.3/h. The patient spent 182 minutes of total sleep time in the supine position and 285 minutes in non-supine.. The supine AHI was 4.0 versus a non-supine AHI of 5.9.  OXYGEN SATURATION & C02:  The Wake baseline 02 saturation was 98%, with the lowest being 87%. Time spent below 89% saturation equaled 1 minute.   PERIODIC LIMB MOVEMENTS:  The patient had a total of 0 Periodic Limb Movements.    IMPRESSION:  1. Very Mild Obstructive Sleep Apnea (OSA) 2. Many spontaneous arousals.   RECOMMENDATIONS:  1. I can  offer Jay Ortiz a CPAP trial, but it is more likely that his excessive daytime sleepiness is related to shift work and restricted sleep time than apnea.  2. I would be happy to provide Modafinil if needed (shift work, mild OSA are FDA approved indications) .   I certify that I have reviewed the entire raw data recording prior to the issuance of this report in accordance with the Standards of Accreditation of the American Academy of Sleep Medicine (AASM)      Jay Novasarmen Claudette Wermuth, MD   10-21-2017  Diplomat, American Board of Psychiatry and Neurology  Diplomat, American Board of Sleep Medicine Medical Director, [facility]

## 2017-10-25 ENCOUNTER — Telehealth: Payer: Self-pay

## 2017-10-25 DIAGNOSIS — G4713 Recurrent hypersomnia: Secondary | ICD-10-CM

## 2017-10-25 NOTE — Telephone Encounter (Signed)
-----   Message from Melvyn Novasarmen Dohmeier, MD sent at 10/21/2017  4:49 PM EDT ----- IMPRESSION:  1. Very Mild Obstructive Sleep Apnea (OSA) 2. Many spontaneous arousals.    Jay Ortiz stayed for a MSLT test the following morning and was unable to sleep in his first nap.   We discontinued his narcolepsy evaluation at that point,  RECOMMENDATIONS:  1. I can offer Mr. Boursiquot a CPAP trial, but it is more likely that  his excessive daytime sleepiness is related to shift work and  restricted sleep time than apnea.  2. I would be happy to provide Modafinil if needed (shift work,  mild OSA are FDA approved indications) .

## 2017-10-25 NOTE — Telephone Encounter (Signed)
I called pt. I advised him of his sleep study results. Pt says that Dr. Vickey Hugerohmeier discussed the sleep study results with him the morning after his sleep study. He is already being prescribed nuvigil by Dr. Vickey Hugerohmeier and he would like to stay on that for now. He says that he will be willing to try a cpap if Dr. Vickey Hugerohmeier recommends it, but would be ok not using one as well. He asked me to run this by Dr. Vickey Hugerohmeier again when she returns to the office in a few weeks. Pt verbalized understanding of results.

## 2017-10-28 DIAGNOSIS — Z0000 Encounter for general adult medical examination without abnormal findings: Secondary | ICD-10-CM | POA: Diagnosis not present

## 2017-11-02 NOTE — Telephone Encounter (Signed)
Pt called back in. He reports that he would like to pursue cpap to treat his EDS. Pt wants to know if Dr. Vickey Hugerohmeier would prefer a cpap titration study or an auto pap for him and to please order whichever is best. Pt understands that Dr. Vickey Hugerohmeier is out of the office for the next several weeks and that this will be addressed upon her return.

## 2017-11-14 NOTE — Telephone Encounter (Signed)
Called the patient and informed him that I discussed with Dr Dohmeier his concern to get started on CPAP therapy. We placed a CPAP titration for the patient which will allow for the patient to come in and see what pressure to order to treat his apnea. Pt verbalized understanding. Order placed and informed once insurance approval is complete we will call and get him scheduled.

## 2017-11-14 NOTE — Addendum Note (Signed)
Addended by: Judi CongBRUNO, Hebe Merriwether C on: 11/14/2017 04:20 PM   Modules accepted: Orders

## 2017-11-17 MED FILL — ARMODAFINIL 250 MG TABLET: 250 | 30 days supply | Qty: 30 | Fill #3

## 2017-11-23 ENCOUNTER — Ambulatory Visit (INDEPENDENT_AMBULATORY_CARE_PROVIDER_SITE_OTHER): Payer: 59 | Admitting: Neurology

## 2017-11-23 DIAGNOSIS — E669 Obesity, unspecified: Secondary | ICD-10-CM

## 2017-11-23 DIAGNOSIS — G4719 Other hypersomnia: Secondary | ICD-10-CM

## 2017-11-23 DIAGNOSIS — E66811 Obesity, class 1: Secondary | ICD-10-CM

## 2017-11-23 DIAGNOSIS — G4733 Obstructive sleep apnea (adult) (pediatric): Secondary | ICD-10-CM

## 2017-11-23 DIAGNOSIS — G4713 Recurrent hypersomnia: Secondary | ICD-10-CM

## 2017-11-23 DIAGNOSIS — G4726 Circadian rhythm sleep disorder, shift work type: Secondary | ICD-10-CM

## 2017-11-26 NOTE — Addendum Note (Signed)
Addended by: Melvyn NovasHMEIER, Lizzette Carbonell on: 11/26/2017 07:09 PM   Modules accepted: Orders

## 2017-11-26 NOTE — Procedures (Signed)
PATIENT'S NAME:  Jay Ortiz, [name] DOB:      [birth year]      MR#:    [id number]     DATE OF RECORDING: 11/23/2017 REFERRING M.D.:  Pearson GrippeJames Kim, M.D. 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  The patient endorsed the Epworth Sleepiness Scale at 17/24 points.   The patient's weight 227 pounds with a height of 68 (inches), resulting in a BMI of 34.4 kg/m2. The patient's neck circumference measured 15 inches.  CURRENT MEDICATIONS: Armodafinil, Allegra, Lisinopril.   PROCEDURE:  This is a multichannel digital polysomnogram utilizing the SomnoStar 11.2 system.  Electrodes and sensors were applied and monitored per AASM Specifications.   EEG, EOG, Chin and Limb EMG, were sampled at 200 Hz.  ECG, Snore and Nasal Pressure, Thermal Airflow, Respiratory Effort, CPAP Flow and Pressure, Oximetry was sampled at 50 Hz. Digital video and audio were recorded.      CPAP was initiated at 5 cmH20 with heated humidity per AASM split night standards and pressure was advanced to 10cmH20 because of hypopneas, apneas and desaturations.  At a PAP pressure of 9 cmH20, there was a reduction of the AHI to 0.0 and improvement of sleep apnea .The SpO2 nadir was 90%, with 98.6% sleep efficiency over 138 minutes of sleep at that setting. An AirFit N 30- i model with wide cushions was used, produced by [employer].   Lights Out was at 21:39 and Lights On at 05:00. Total recording time (TRT) was 441.5 minutes, with a total sleep time (TST) of 412.5 minutes. The patient's sleep latency was 5.5 minutes. REM latency was 229 minutes.  The sleep efficiency was 93.4 %.  The patient suffered from bronchitis and was coughing during the titration.   SLEEP ARCHITECTURE: WASO (Wake  after sleep onset) was 23 minutes.  There were 21 minutes in Stage N1, 294.5 minutes Stage N2, 87 minutes Stage N3 and 10 minutes in Stage REM.  The percentage of Stage N1 was 5.1%, Stage N2 was 71.4%, Stage N3 was 21.1% and Stage R (REM sleep) was 2.4%.   RESPIRATORY ANALYSIS:  There were 2 respiratory events: 0 apneas and 2 hypopneas with 0 respiratory event related arousals (RERAs).     The total APNEA/HYPOPNEA INDEX (AHI) was 0.3 /hour and the total RESPIRATORY DISTURBANCE INDEX was 0.3 hour  0 events occurred in REM sleep and 2 events in NREM. The REM AHI was 0.0 /hour versus a non-REM AHI of 0.3 /hour.  The patient spent 392.5 minutes of total sleep time in the supine position and 20 minutes in non-supine. The supine AHI was 0.3.  OXYGEN SATURATION & C02:  The baseline 02 saturation was 97%, with the lowest being 89%. Time spent below 89% saturation equaled 0 minutes.  PERIODIC LIMB MOVEMENTS:  The patient had a total of 104 Periodic Limb Movements. The Periodic Limb Movement (PLM) index was 15.1 and the PLM Arousal index was 2.3 /hour. The arousals were noted as: 50 were spontaneous, 16 were associated with PLMs, and 1 was associated with respiratory events.  Audio and video analysis did not show any abnormal or unusual movements, behaviors, phonations or vocalizations, but several coughing spells.    No nocturia. EKG was in keeping with normal sinus rhythm (  NSR).   DIAGNOSIS 1) Obstructive Sleep Apnea responded well to CPAP under a pressure of 9 cm water. An AirFit N 30- i model with wide cushions was used, produced by [employer].  2) Shift work circadian rhythm disorder. This titration study was performed during nighttime hours and may have allowed more sleep time than the patient usually experiences in daytime sleep.  3) Post-study, the patient indicated that sleep was more interrupted than usual. He will be more successful if his bronchitis condition is fully cleared before initiating CPAP.      PLANS/RECOMMENDATIONS: use an auto-titration capable CPAP machine between 6 and 12 cm water with 3 cm EPR, heated humidity and AirFit N 30- i model with wide cushions compliantly for at least 4 hours per 24 hour period.    DISCUSSION: Weight loss and exercise will also help daytime sleepiness and reduce apnea.   A follow up appointment will be scheduled in the Sleep Clinic at [facility].   Please call [phone] with any questions.     I certify that I have reviewed the entire raw data recording prior to the issuance of this report in accordance with the Standards of Accreditation of the American Academy of Sleep Medicine (AASM)    Melvyn Novas, M.D.  11-26-2017  Diplomat, American Board of Psychiatry and Neurology  Diplomat, American Board of Sleep Medicine Medical Director, [facility]

## 2017-11-28 ENCOUNTER — Encounter: Payer: Self-pay | Admitting: Neurology

## 2017-12-02 MED FILL — LISINOPRIL 10 MG TABLET: 10 | 90 days supply | Qty: 135 | Fill #2

## 2017-12-14 DIAGNOSIS — G4733 Obstructive sleep apnea (adult) (pediatric): Secondary | ICD-10-CM | POA: Diagnosis not present

## 2017-12-19 MED FILL — ARMODAFINIL 250 MG TABLET: 250 | 30 days supply | Qty: 30 | Fill #4

## 2018-01-12 ENCOUNTER — Ambulatory Visit: Payer: BLUE CROSS/BLUE SHIELD | Admitting: Neurology

## 2018-01-14 DIAGNOSIS — G4733 Obstructive sleep apnea (adult) (pediatric): Secondary | ICD-10-CM | POA: Diagnosis not present

## 2018-02-13 DIAGNOSIS — G4733 Obstructive sleep apnea (adult) (pediatric): Secondary | ICD-10-CM | POA: Diagnosis not present

## 2018-02-14 DIAGNOSIS — E669 Obesity, unspecified: Secondary | ICD-10-CM | POA: Diagnosis not present

## 2018-02-14 DIAGNOSIS — Z6837 Body mass index (BMI) 37.0-37.9, adult: Secondary | ICD-10-CM | POA: Diagnosis not present

## 2018-02-14 DIAGNOSIS — I10 Essential (primary) hypertension: Secondary | ICD-10-CM | POA: Diagnosis not present

## 2018-02-24 MED FILL — SAXENDA 18 MG/3 ML PEN: 18 | 30 days supply | Qty: 15 | Fill #0

## 2018-02-27 ENCOUNTER — Other Ambulatory Visit: Payer: Self-pay | Admitting: Neurology

## 2018-02-27 MED FILL — LISINOPRIL 10 MG TABLET: 10 | 90 days supply | Qty: 135 | Fill #3

## 2018-03-06 ENCOUNTER — Encounter: Payer: Self-pay | Admitting: Neurology

## 2018-03-08 ENCOUNTER — Encounter: Payer: Self-pay | Admitting: Neurology

## 2018-03-08 ENCOUNTER — Ambulatory Visit: Payer: 59 | Admitting: Neurology

## 2018-03-08 VITALS — BP 132/73 | HR 83 | Ht 69.0 in | Wt 217.0 lb

## 2018-03-08 DIAGNOSIS — Z9989 Dependence on other enabling machines and devices: Secondary | ICD-10-CM | POA: Diagnosis not present

## 2018-03-08 DIAGNOSIS — G4733 Obstructive sleep apnea (adult) (pediatric): Secondary | ICD-10-CM

## 2018-03-08 DIAGNOSIS — G4726 Circadian rhythm sleep disorder, shift work type: Secondary | ICD-10-CM | POA: Diagnosis not present

## 2018-03-08 HISTORY — DX: Obstructive sleep apnea (adult) (pediatric): G47.33

## 2018-03-08 MED FILL — ARMODAFINIL 250 MG TABLET: 250 | 30 days supply | Qty: 30 | Fill #0

## 2018-03-08 NOTE — Patient Instructions (Addendum)
Please remember to try to maintain good sleep hygiene, which means: Keep a regular sleep and wake schedule, try not to exercise or have a meal within 2 hours of your bedtime, try to keep your bedroom conducive for sleep, that is, cool and dark, without light distractors such as an illuminated alarm clock, and refrain from watching TV right before sleep or in the middle of the night and do not keep the TV or radio on during the night. Also, try not to use or play on electronic devices at bedtime, such as your cell phone, tablet PC or laptop. If you like to read at bedtime on an electronic device, try to dim the background light as much as possible. Do not eat in the middle of the night.     

## 2018-03-08 NOTE — Progress Notes (Signed)
SLEEP MED[facility]INE CLINIC   Provider:  Melvyn Novas, M D  Primary Care Physician:  Pearson Grippe, MD   Referring Provider: Pearson Grippe, MD    Chief Complaint  Patient presents with  . Follow-up    pt here for initial cpap follow up. alone, rm 10. pt states CPAP is working well. he states that he doesnt feel nearly as tired as he was and finds he doesnt have to use the armodafinil as often. no concerns to discuss. 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 We called him in for a repeated test, a diagnostic  baseline polysomnogram on 19 October 2017.  He had endorsed the Epworth Sleepiness Scale at 17 out of 24 points prior to this test he had rather mild apnea with an AHI of 5.1 but during REM sleep his AHI exacerbated to 22.9/h.  There was not much of the positional component to his apnea and they were many many spontaneous arousals.   His baseline polysomnography was followed by an attended CPAP titration beginning at 5 cm water pressure and advancing to 10 when the AHI was reduced to 0.0.  The patient had frequent limb movements during the night but less frequent spontaneous arousals and almost none of the arousals was in relation to a respiratory event.  The total AHI became 0.3/h.  The study was followed up by a auto titration CPAP , he is using a Philips Respironics C-Flex machine provided by [facility].  The patient has used the machine for 84% of the 30-day.  He was not able to use the machine on 5 out of 30 days.  His average AHI is 3.7 which is a reduction in comparison to his baseline, he does not have major air leaks and the average device pressure is 8.2 cmH2O.  He is using a C-Flex machine with a ramp time of 10 minutes and a starting pressure of 4 cmH2O minimum pressure is 6 maximum 12 cmH2O.  3 cm EPR he feels that the machine is comfortable his  daytime sleepiness is significantly reduced by upwards to 4 out of 24 points,     Originally seen in referral from Dr. Selena Batten for an evaluation of excessive daytime sleepiness and non-restorative sleep.   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  Sleep habits are as follows: Jay Ortiz prefers to go to bed at 8 PM and he can usually sleep through the night when he is not on third shift.  He will fall asleep easily at that time and then rises in the morning with an alarm at 8 AM.  His sleep quality for nighttime sleep is good he does not believe that he snores and he has not been told that he has apnea. His bedroom at home is cool quiet and dark and during the time that he works third shift he is undisturbed.  He sleeps mainly on his side, but he does turn and change sleep position , he uses 2 pillows. He will go home after a night shift which concludes about  7 AM and reaches home at 8, usually he will try to sleep as early as possible, and in daytime is able to sleep for about 5:30 PM, waking again with alarm. He has worked third shift for a long time even while attending nursing school, but he does feel that now that he is enrolled in daytime classes and continues to work nights he has been more excessively daytime sleepy, and he feels sleep deprived. He is enrolled in the graduate school since autumn of 2017. Dr Selena Batten started modafinil.   Sleep medical history and family sleep history: father is a snorer, older sister is healthy, mother is healthy. Tonsillectomy at age 44 , no History of neck injury, ENT surgery, TBI.   Social history: single, no children, grew up in [place],  Non smoker, non or rarely drinking alcohol- and cut down on caffeine - 1 cup in AM and one at [employer]. He used to drink coffee all day and at  night during shift. he is enrolled in daytime classes and continues to work nights he has been more excessively daytime sleepy, and he feels sleep deprived. He is enrolled in the graduate school since autumn of 2017.    Review of Systems:no snoring, no nocturia, no RLS,  Out of a complete 14 system review, the patient complains of only the following symptoms, and all other reviewed systems are negative.  Epworth score down to 4 points from 17 / 24 before CPAP  , Fatigue severity score  Was 17 / 63 ,  Hearing loss with tinnitus left ear- he has a hearing aid and is not using it today.  depression score n/a ,   Third shift worker on modafinil now. Side effects from modafinil - none.   No flowsheet data found.     Social History   Socioeconomic History  . Marital status: Single    Spouse name: Not on file  . Number of children: Not on file  . Years of education: Not on file  . Highest education level: Not on file  Occupational History  . Not on file  Social Needs  . Financial resource strain: Not on file  . Food insecurity:    Worry: Not on file    Inability: Not on file  . Transportation needs:    Medical: Not on file    Non-medical: Not on file  Tobacco Use  . Smoking status: Never Smoker  . Smokeless tobacco: Never Used  Substance and Sexual Activity  . Alcohol use: Yes    Frequency: Never    Comment: socially  . Drug use: No  . Sexual activity: Not on file  Lifestyle  . Physical activity:    Days per week: Not on file    Minutes per session: Not on file  . Stress: Not on file  Relationships  . Social connections:    Talks on phone: Not on file    Gets together: Not on file    Attends religious service: Not on file    Active member of club or organization: Not on file    Attends meetings of clubs or organizations: Not on file    Relationship status: Not on file  . Intimate partner violence:    Fear of current or ex partner: Not on file    Emotionally abused:  Not on file    Physically abused: Not on file    Forced sexual activity: Not on file  Other Topics Concern  . Not on  file  Social History Narrative  . Not on file    Family History  Problem Relation Age of Onset  . Ulcers Mother   . Hypertension Father   . Hyperlipidemia Father   . CVA Maternal Grandmother   . Diabetes Mellitus II Maternal Grandfather   . Colon cancer Paternal Grandmother   . Heart attack Paternal Grandfather     Past Medical History:  Diagnosis Date  . Daytime sleepiness   . 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 Hypertension     Current Outpatient Medications  Medication Sig Dispense Refill  . Armodafinil 250 MG tablet TAKE 1 TABLET BY MOUTH DAILY 30 tablet 5  . fexofenadine (ALLEGRA) 60 MG tablet Take 60 mg by mouth as needed for allergies or rhinitis.    Marland Kitchen lisinopril (PRINIVIL,ZESTRIL) 10 MG tablet Take 15 mg by mouth daily.    Marland Kitchen SAXENDA 18 MG/3ML SOPN   1   No current facility-administered medications for this visit.     Allergies as of 03/08/2018  . (No Known Allergies)    Vitals: BP 132/73   Pulse 83   Ht 5\' 9"  (1.753 m)   Wt 217 lb (98.4 kg)   BMI 32.05 kg/m  Last Weight:  Wt Readings from Last 1 Encounters:  03/08/18 217 lb (98.4 kg)   NWG:NFAO mass index is 32.05 kg/m.     Last Height:   Ht Readings from Last 1 Encounters:  03/08/18 5\' 9"  (1.753 m)    Physical exam:  General: The patient is awake, alert and appears not in acute distress. The patient is well groomed. Head: Normocephalic, atraumatic. Neck is supple. Mallampati 3, tooth gap  neck circumference: 15.5 . Nasal airflow patent,  Retrognathia is not seen. He wore braces in middle school and later a retainer.  Cardiovascular:  Regular rate and rhythm, without  murmurs or carotid bruit, and without distended neck veins. Respiratory: Lungs are clear to auscultation. Skin:  Without evidence of edema, or rash Trunk: BMI is 35. The patient's posture is  erect.  Neurologic exam : The patient is awake and alert, oriented to place and time.   Mood and affect are appropriate.  Cranial nerves: Pupils are equal and briskly reactive to light.Visual fields by finger perimetry are intact.Hearing to finger rub intact.  Facial sensation intact to fine touch. Facial motor strength is symmetric and tongue and uvula move midline. Shoulder shrug was symmetrical.  Motor exam: Normal tone, muscle bulk and symmetric strength in all extremities. Sensory:  normal. Coordination: Rapid alternating movements in the fingers/hands was normal. Finger-to-nose maneuver  normal without evidence of ataxia, dysmetria or tremor.Gait and station: Patient walks without assistive deviceDeep tendon reflexes: in the  upper and lower extremities are symmetric and intact.   Assessment:  After physical and neurologic examination, review of laboratory studies,  Personal review of  results of testing and pre-existing records as far as provided in visit., my assessment is :  Jay Ortiz has thus far not fallen asleep in class and he denies having the irresistible urge to sleep,  but he endorses a higher degree of daytime sleepiness than most.   Modafinil have been prescribed to help him stay alert during shifts and also make it safe for him to return home, but he felt that it has been minimal effect on him.   He tested positive for mild sleep apnea but CPAP has influenced the sleep architecture and quality, he is much less daytime  sleepy. His compliance is reduced by the hours of daytime sleep available to him.  He seems not to get enough sleep when he needs it. Third shift work will stop once her graduates in May 2020.  so he will need some help to bridge over for the next 72-months.   The patient was advised of the nature of the diagnosed disorder , the treatment options and the  risks for general health and wellness arising from not treating the condition. EDS is not narcolepsy related, no  cataplexy, no sleep paralysis, no dream intrusion.   I spent more than 15 minutes of face to face time with the patient.  Greater than 50% of time was spent in counseling and coordination of care. We have discussed the diagnosis and differential and I answered the patient's questions.    Plan:  Treatment plan and additional workup :  CPAP and if needed modafinil  RV in 12 month with NP or me.    Melvyn Novas, MD 03/08/2018, 3:10 PM  Certified in Neurology by ABPN Certified in Sleep Medicine by [facility] [address], [address] [place], [place] [address]

## 2018-03-16 DIAGNOSIS — G4733 Obstructive sleep apnea (adult) (pediatric): Secondary | ICD-10-CM | POA: Diagnosis not present

## 2018-04-03 DIAGNOSIS — I10 Essential (primary) hypertension: Secondary | ICD-10-CM | POA: Diagnosis not present

## 2018-04-03 DIAGNOSIS — E669 Obesity, unspecified: Secondary | ICD-10-CM | POA: Diagnosis not present

## 2018-04-28 DIAGNOSIS — G4733 Obstructive sleep apnea (adult) (pediatric): Secondary | ICD-10-CM | POA: Diagnosis not present

## 2018-04-28 MED FILL — ARMODAFINIL 250 MG TABLET: 250 | 30 days supply | Qty: 30 | Fill #1

## 2018-04-28 MED FILL — SAXENDA 18 MG/3 ML PEN: 18 | 30 days supply | Qty: 15 | Fill #1

## 2018-06-14 MED FILL — ARMODAFINIL 250 MG TABLET: 250 | 30 days supply | Qty: 30 | Fill #2

## 2018-06-14 MED FILL — LISINOPRIL 10 MG TABS: 10 | 90 days supply | Qty: 135 | Fill #0

## 2018-08-22 ENCOUNTER — Ambulatory Visit: Payer: 59 | Admitting: Family Medicine

## 2018-09-26 DIAGNOSIS — G4733 Obstructive sleep apnea (adult) (pediatric): Secondary | ICD-10-CM | POA: Diagnosis not present

## 2018-10-30 ENCOUNTER — Other Ambulatory Visit: Payer: Self-pay | Admitting: Neurology

## 2018-10-30 MED FILL — LISINOPRIL 10 MG TABS: 10 | 90 days supply | Qty: 135 | Fill #1

## 2018-10-30 MED FILL — ARMODAFINIL 250 MG TABLET: 250 | 30 days supply | Qty: 30 | Fill #0

## 2019-03-14 ENCOUNTER — Ambulatory Visit: Payer: 59 | Admitting: Adult Health

## 2019-04-24 ENCOUNTER — Encounter: Payer: Self-pay | Admitting: Adult Health

## 2019-04-24 ENCOUNTER — Other Ambulatory Visit: Payer: Self-pay

## 2019-04-24 ENCOUNTER — Ambulatory Visit: Payer: BC Managed Care – PPO | Admitting: Adult Health

## 2019-04-24 VITALS — BP 125/85 | HR 87 | Temp 97.8°F | Ht 69.0 in | Wt 249.0 lb

## 2019-04-24 DIAGNOSIS — Z9989 Dependence on other enabling machines and devices: Secondary | ICD-10-CM | POA: Diagnosis not present

## 2019-04-24 DIAGNOSIS — G4733 Obstructive sleep apnea (adult) (pediatric): Secondary | ICD-10-CM

## 2019-04-24 NOTE — Patient Instructions (Signed)
Continue using CPAP nightly and greater than 4 hours each night °If your symptoms worsen or you develop new symptoms please let us know.  ° °

## 2019-04-24 NOTE — Progress Notes (Signed)
PATIENT: Jay Ortiz DOB: December 21, 1985  REASON FOR VISIT: follow up HISTORY FROM: patient  HISTORY OF PRESENT ILLNESS: Today 04/24/19:  Mr. Jay Ortiz is a 33 year old male with a history of obstructive sleep apnea on CPAP.  His download indicates that he uses machine 22 out of 30 days for compliance of 73.3%.  On average he uses machine greater than 4 hours 46.7%.  He uses his machine 4 hours and 49 minutes on average each night.  His residual AHI is 4.1 on 6 to 12 cm of water.  He states that he changed jobs and insurance so there was a period of time he did not get new supplies and was not using the machine.  He is back to using the machine but his mask and straps change.  He reports that he is trying to get used to using the machine again.  He states that he does notice the benefit when he was using the CPAP regularly.  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 Jay Ortiz underwent a HST  And this test was negative for apnea. We called him in for a repeated test, a diagnostic  baseline polysomnogram on 19 October 2017.  He had endorsed the Epworth Sleepiness Scale at 17 out of 24 points prior to this test he had rather mild apnea with an AHI of 5.1 but during REM sleep his AHI exacerbated to 22.9/h.  There was not much of the positional component to his apnea and they were many many spontaneous arousals.   His baseline polysomnography was followed by an attended CPAP titration beginning at 5 cm water pressure and advancing to 10 when the AHI was reduced to 0.0.  The patient had frequent limb movements during the night but less frequent spontaneous arousals and almost none of the arousals was in relation to a respiratory event.  The total AHI became 0.3/h.  The study was followed up by a auto titration CPAP , he is using a Philips Respironics C-Flex machine provided by [facility].  The patient has used the machine for 84% of the 30-day.  He  was not able to use the machine on 5 out of 30 days.  His average AHI is 3.7 which is a reduction in comparison to his baseline, he does not have major air leaks and the average device pressure is 8.2 cmH2O.  He is using a C-Flex machine with a ramp time of 10 minutes and a starting pressure of 4 cmH2O minimum pressure is 6 maximum 12 cmH2O.  3 cm EPR he feels that the machine is comfortable his daytime sleepiness is significantly reduced by upwards to 4 out of 24 points,  REVIEW OF SYSTEMS: Out of a complete 14 system review of symptoms, the patient complains only of the following symptoms, and all other reviewed systems are negative.  See HPI  ALLERGIES: No Known Allergies  HOME MEDICATIONS: Outpatient Medications Prior to Visit  Medication Sig Dispense Refill  . Armodafinil 250 MG tablet TAKE 1 TABLET BY MOUTH DAILY 30 tablet 5  . fexofenadine (ALLEGRA) 60 MG tablet Take 60 mg by mouth as needed for allergies or rhinitis.    Marland Kitchen lisinopril (PRINIVIL,ZESTRIL) 10 MG tablet Take 15 mg by mouth daily.    Marland Kitchen SAXENDA 18 MG/3ML SOPN   1   No facility-administered medications prior to visit.    PAST MEDICAL HISTORY: Past Medical History:  Diagnosis  Date  . Daytime sleepiness   . 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 Hypertension     PAST SURGICAL HISTORY: Past Surgical History:  Procedure Laterality Date  . TONSILLECTOMY      FAMILY HISTORY: Family History  Problem Relation Age of Onset  . Ulcers Mother   . Hypertension Father   . Hyperlipidemia Father   . CVA Maternal Grandmother   . Diabetes Mellitus II Maternal Grandfather   . Colon cancer Paternal Grandmother   . Heart attack Paternal Grandfather     SOCIAL HISTORY: Social History   Socioeconomic History  . Marital status: Single    Spouse name: Not on file  . Number of children: Not on file  . Years of education: Not on file  . Highest education level: Not on file  Occupational History  . Not on  file  Tobacco Use  . Smoking status: Never Smoker  . Smokeless tobacco: Never Used  Substance and Sexual Activity  . Alcohol use: Yes    Comment: socially  . Drug use: No  . Sexual activity: Not on file  Other Topics Concern  . Not on file  Social History Narrative  . Not on file   Social Determinants of Health   Financial Resource Strain:   . Difficulty of Paying Living Expenses: Not on file  Food Insecurity:   . Worried About Charity fundraiser in the Last Year: Not on file  . Ran Out of Food in the Last Year: Not on file  Transportation Needs:   . Lack of Transportation (Medical): Not on file  . Lack of Transportation (Non-Medical): Not on file  Physical Activity:   . Days of Exercise per Week: Not on file  . Minutes of Exercise per Session: Not on file  Stress:   . Feeling of Stress : Not on file  Social Connections:   . Frequency of Communication with Friends and Family: Not on file  . Frequency of Social Gatherings with Friends and Family: Not on file  . Attends Religious Services: Not on file  . Active Member of Clubs or Organizations: Not on file  . Attends Archivist Meetings: Not on file  . Marital Status: Not on file  Intimate Partner Violence:   . Fear of Current or Ex-Partner: Not on file  . Emotionally Abused: Not on file  . Physically Abused: Not on file  . Sexually Abused: Not on file      PHYSICAL EXAM  Vitals:   04/24/19 0910  BP: 125/85  Pulse: 87  Temp: 97.8 F (36.6 C)  TempSrc: Oral  Weight: 249 lb (112.9 kg)  Height: 5\' 9"  (1.753 m)   Body mass index is 36.77 kg/m.  Generalized: Well developed, in no acute distress  Chest: Lungs clear to auscultation bilaterally  Neurological examination  Mentation: Alert oriented to time, place, history taking. Follows all commands speech and language fluent Cranial nerve II-XII: Extraocular movements were full, visual field were full on confrontational test Head turning and shoulder  shrug  were normal and symmetric. Motor: The motor testing reveals 5 over 5 strength of all 4 extremities. Good symmetric motor tone is noted throughout.  Sensory: Sensory testing is intact to soft touch on all 4 extremities. No evidence of extinction is noted.  Gait and station: Gait is normal.    DIAGNOSTIC DATA (LABS, IMAGING, TESTING) - I reviewed patient records, labs, notes, testing and imaging myself where available.  Lab Results  Component Value Date   WBC 17.6 (H) 05/21/2017   HGB 15.5 05/21/2017   HCT 44.6 05/21/2017   MCV 85.4 05/21/2017   PLT 370 05/21/2017      Component Value Date/Time   NA 138 05/21/2017 0007   K 3.2 (L) 05/21/2017 0007   CL 106 05/21/2017 0007   CO2 19 (L) 05/21/2017 0007   GLUCOSE 121 (H) 05/21/2017 0007   BUN 14 05/21/2017 0007   CREATININE 0.92 05/21/2017 0007   CALCIUM 9.0 05/21/2017 0007   PROT 7.5 05/21/2017 0007   ALBUMIN 4.7 05/21/2017 0007   AST 282 (H) 05/21/2017 0007   ALT 90 (H) 05/21/2017 0007   ALKPHOS 39 05/21/2017 0007   BILITOT 0.5 05/21/2017 0007   GFRNONAA >60 05/21/2017 0007   GFRAA >60 05/21/2017 0007      ASSESSMENT AND PLAN 33 y.o. 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 here with :  1. Obstructive sleep apnea on CPAP  The patient's CPAP download shows suboptimal compliance but good treatment of his apnea when he uses the machine.Marland Kitchen.  He is encouraged to continue using CPAP nightly and greater than 4 hours each night.  He is advised that if his symptoms worsen or he develops new symptoms he should let us know.  He will follow-up in  1 year or sooner if needed.     I spent 15 minutes with the patient. 50% of this time was spent reviewing CPAP download   Butch PennyMegan Braleigh Massoud, MSN, NP-C 04/24/2019, 8:59 AM [facility] [address], [address] [place], [place] [address] [phone]

## 2019-08-19 IMAGING — US US ABDOMEN COMPLETE
1 series · 14 of 25 positions shown · non-contrast
Comparison: None.

CLINICAL DATA: Elevated liver enzymes.  Vomiting.

EXAM:
ABDOMEN ULTRASOUND COMPLETE

[Series 1: us abdomen complete · 0.24mm/px · 14 of 50 slices shown]
[im 1/50]
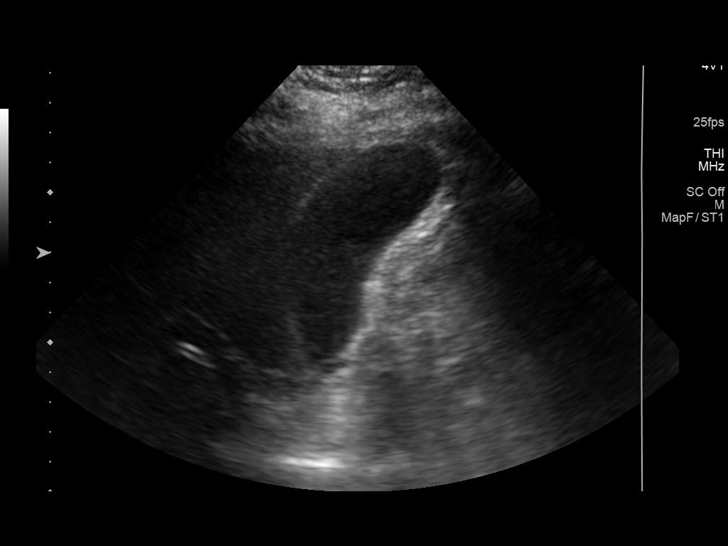
[im 5/50]
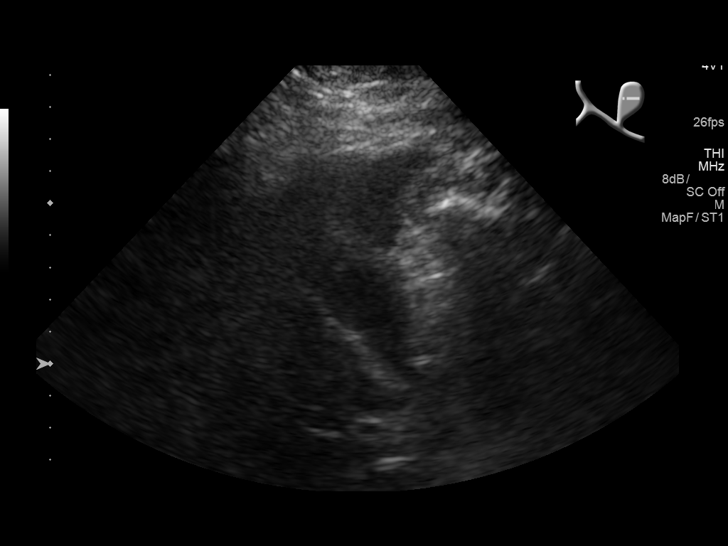
[im 9/50]
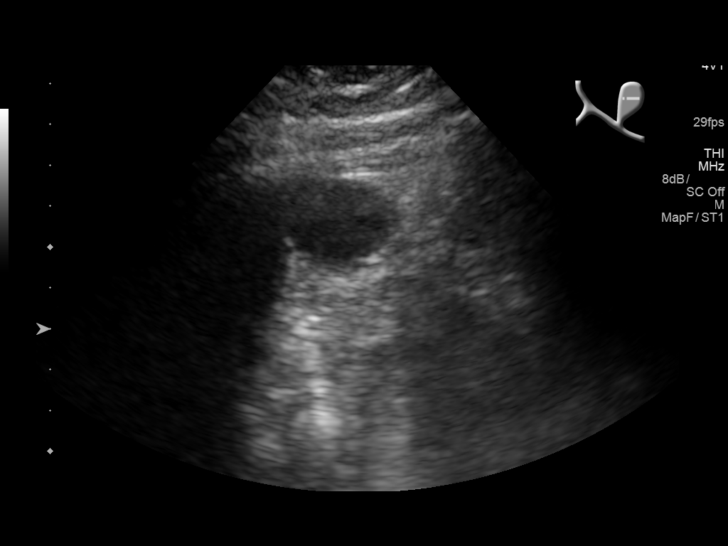
[im 13/50]
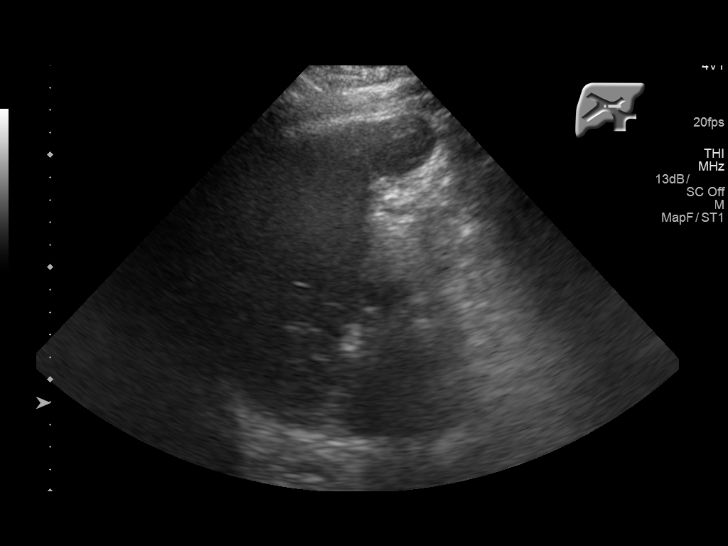
[im 17/50]
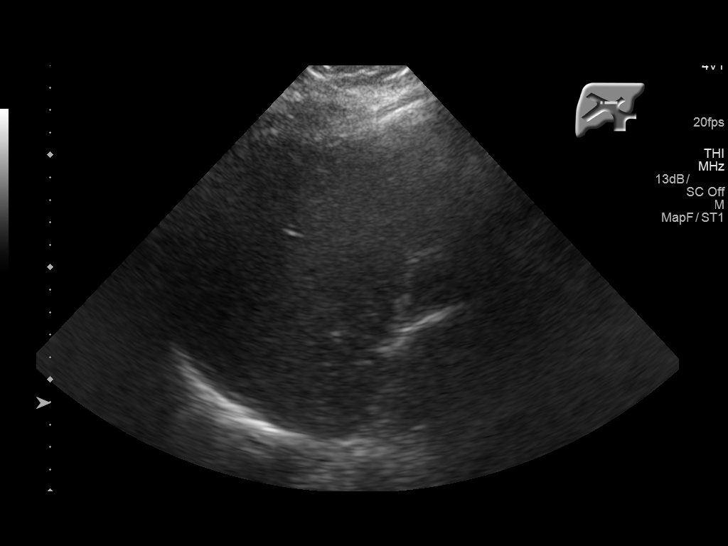
[im 19/50]
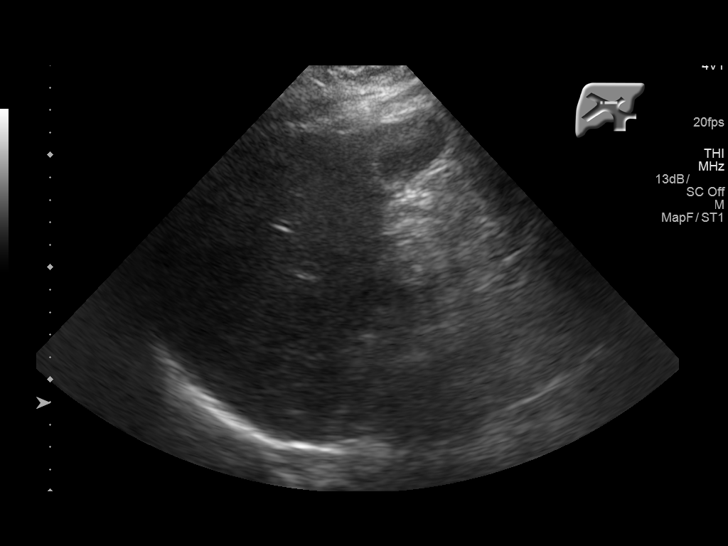
[im 23/50]
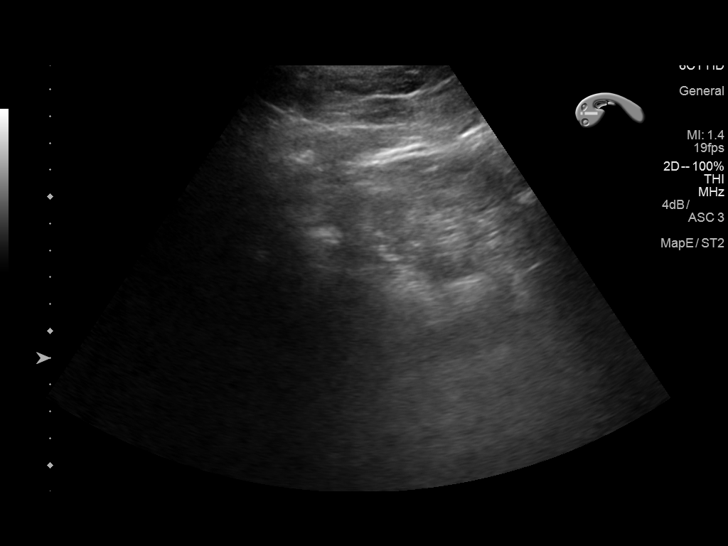
[im 27/50]
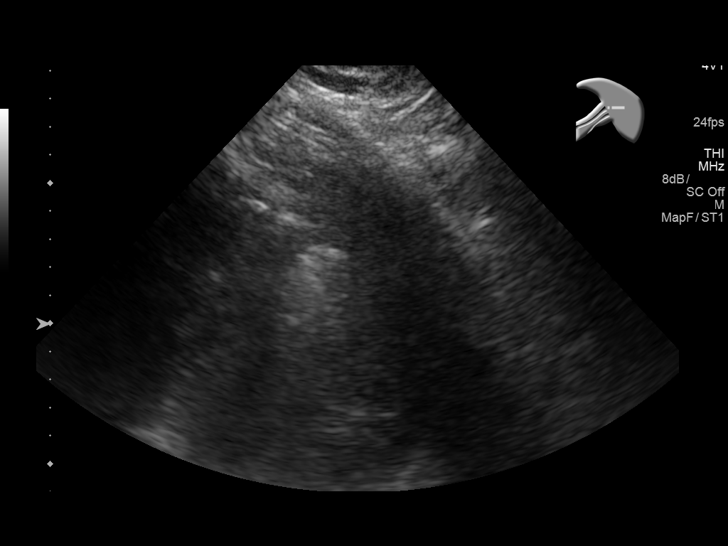
[im 31/50]
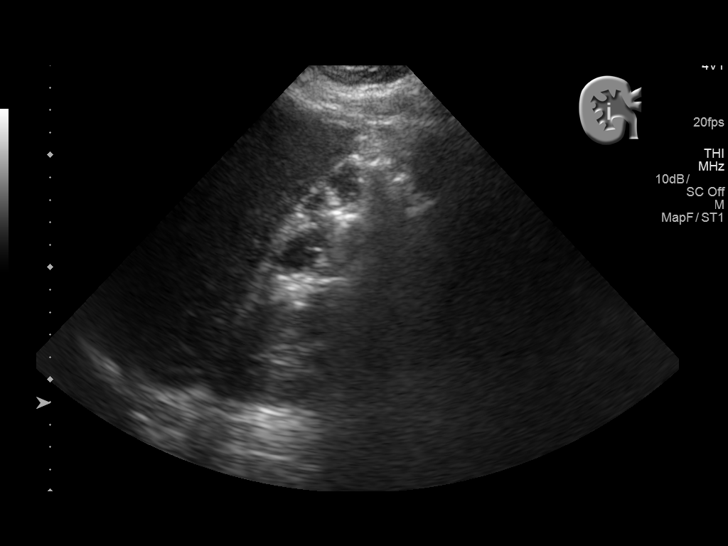
[im 33/50]
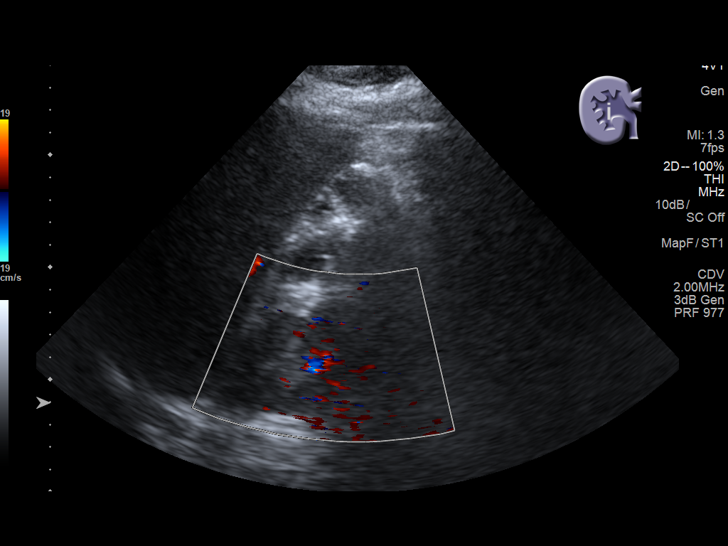
[im 37/50]
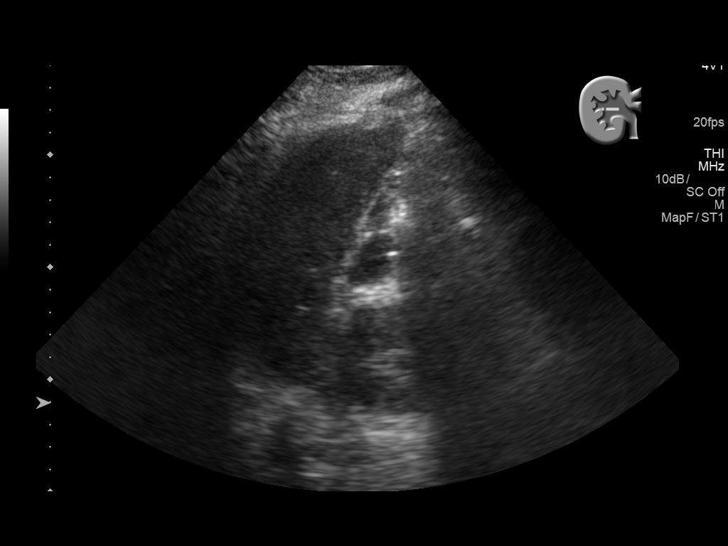
[im 41/50]
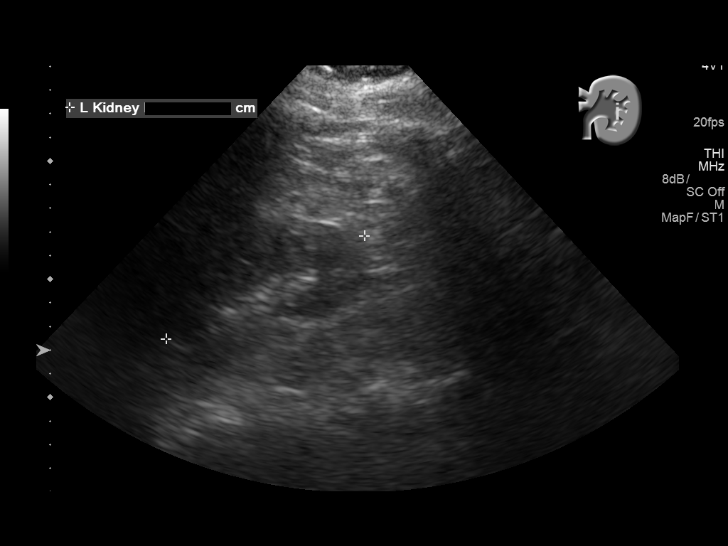
[im 45/50]
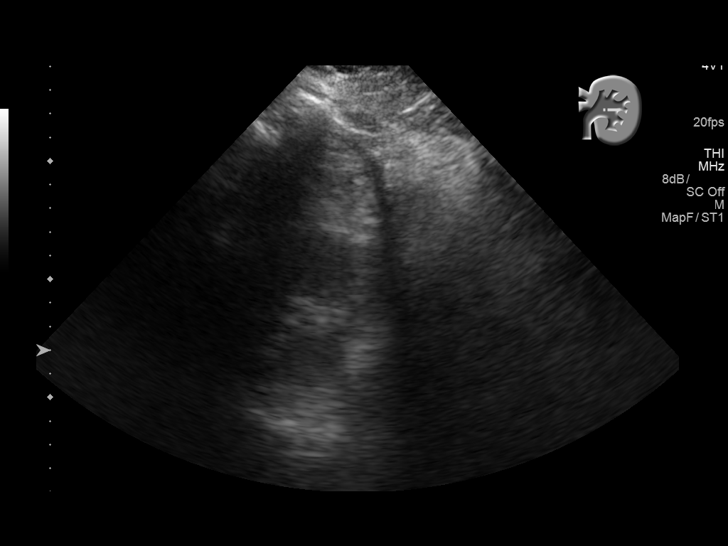
[im 50/50]
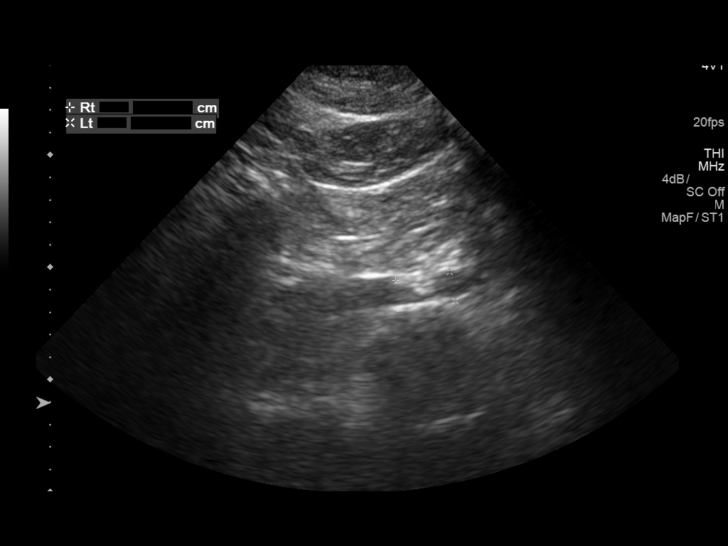

[14 of 25 positions shown; findings below may reference images not displayed]

FINDINGS: The examination is markedly limited by the patient's body habitus
and the amount of bowel gas.

Gallbladder: No gallstones or wall thickening visualized. No
sonographic Murphy sign noted by sonographer.

Common bile duct: Not visualized due to bowel gas

Liver: Poor visualization due to body habitus. Portal vein is patent
on color Doppler imaging with normal direction of blood flow towards
the liver.

IVC: Not visualized due to bowel gas/body habitus.

Pancreas: Not visualized due to bowel gas/body habitus.

Spleen: Size and appearance within normal limits.

Right Kidney: Length: 9.1 cm. Poorly visualized due to bowel
gas/body habitus.

Left Kidney: Length: 9.5 cm. Poorly visualized due to bowel gas/body
habitus.

Abdominal aorta: No aneurysm visualized.

Other findings: None.
IMPRESSION: 1. Markedly limited examination due to patient body habitus, the
amount of bowel gas and reported patient intoxication.
2. No visualized acute abnormality within the above described
limitations.

## 2020-01-29 ENCOUNTER — Encounter: Payer: Self-pay | Admitting: Adult Health

## 2020-04-29 ENCOUNTER — Ambulatory Visit: Payer: BC Managed Care – PPO | Admitting: Adult Health

## 2020-05-13 ENCOUNTER — Ambulatory Visit: Payer: BC Managed Care – PPO | Admitting: Adult Health

## 2021-01-20 DIAGNOSIS — H5203 Hypermetropia, bilateral: Secondary | ICD-10-CM | POA: Diagnosis not present

## 2021-01-26 NOTE — Patient Instructions (Addendum)
Recommendations from today's visit: We will check labs today and let you know if there are any concerning findings that we need to discuss.  Let me know if your blood pressure stays elevated and we can address this If you feel the hydroxyzine is helping, let me know and we can send in 90 days supply next time. If it isn't helpful, let me know and we can look into other medications.  It was so good to see you!!  Information on diet, exercise, and health maintenance recommendations are listed below. This is information to help you be sure you are on track for optimal health and monitoring.   Please look over this and let us know if you have any questions or if you have completed any of the health maintenance outside of [facility] so that we can be sure your records are up to date.  ___________________________________________________________  Thank you for choosing [facility] for your Primary Care needs. I am excited for the opportunity to partner with you to meet your health care goals. It was a pleasure meeting you today!  I am an Adult-Geriatric Nurse Practitioner with a background in caring for patients for more than 20 years. I provide primary care and sports medicine services to patients age 79 and older within this office. I am also the director of the APP Fellowship with [facility].   I am passionate about providing the best service to you through preventive medicine and supportive care. I consider you a part of the medical team and value your input. I work diligently to ensure that you are heard and your needs are met in a safe and effective manner. I want you to feel comfortable with me as your provider and want you to know that your health concerns are important to me.  For your information, our office hours are Monday- Friday 8:00 AM - 5:00 PM At this time I am not in the office on Wednesdays.  If you have questions or concerns, please call our  office at [phone] or send [place] a MyChart message and we will respond as quickly as possible.   For all urgent or time sensitive needs we ask that you please call the office to avoid delays. MyChart is not constantly monitored and replies may take up to 72 business hours.  MyChart Policy: MyChart allows for you to see your visit notes, after visit summary, provider recommendations, lab and tests results, make an appointment, request refills, and contact your provider or the office for non-urgent questions or concerns. Providers are seeing patients during normal business hours and do not have built in time to review MyChart messages.  We ask that you allow a minimum of 4 business days for responses to [employer]. For this reason, please do not send urgent requests through [place]. Please call the office at [phone]. Complex MyChart concerns may require a visit. Your provider may request you schedule a virtual or in person visit to ensure we are providing the best care possible. MyChart messages sent after 4:00 PM on Friday will not be received by the provider until Monday morning.    Lab and Test Results: You will receive your lab and test results on MyChart as soon as they are completed and results have been sent by the lab or testing facility. Due to this service, you will receive your results BEFORE your provider.  I review lab and tests results each morning prior to seeing patients. Some  results require collaboration with other providers to ensure you are receiving the most appropriate care. For this reason, we ask that you please allow a minimum of 4 business days for your provider to receive and review lab and test results and contact you about these.  Most lab and test result comments from the provider will be sent through [place]. Your provider may recommend changes to the plan of care, follow-up visits, repeat testing, ask questions, or request an office visit to discuss these  results. You may reply directly to this message or call the office at [phone] to provide information for the provider or set up an appointment. In some instances, you will be called with test results and recommendations. Please let us know if this is preferred and we will make note of this in your chart to provide this for you.    If you have not heard a response to your lab or test results in 72 business hours, please call the office to let us know.   After Hours: For all non-emergency after hours needs, please call the office at [phone] and select the option to reach the on-call provider service. On-call services are shared between multiple [place] offices and therefore it will not be possible to speak directly with your provider. On-call providers may provide medical advice and recommendations, but are unable to provide refills for maintenance medications.  For all emergency or urgent medical needs after normal business hours, we recommend that you seek care at the closest Urgent Care or Emergency Department to ensure appropriate treatment in a timely manner.  [facility] has a 24 hour emergency room located on the ground floor for your convenience.    Please do not hesitate to reach out to [place] with concerns.   Thank you, again, for choosing me as your health care partner. I appreciate your trust and look forward to learning more about you.   Worthy Keeler, DNP, AGNP-c ___________________________________________________________  Health Maintenance Recommendations Screening Testing Mammogram Every 1 -2 years based on history and risk factors Starting at age 4 Pap Smear Ages 21-39 every 3 years Ages 16-65 every 5 years with HPV testing More frequent testing may be required based on results and history Colon Cancer Screening Every 1-10 years based on test performed, risk factors, and history Starting at age 41 Bone Density Screening Every 2-10 years  based on history Starting at age 9 for women Recommendations for men differ based on medication usage, history, and risk factors AAA Screening One time ultrasound Men 44-6 years old who have every smoked Lung Cancer Screening Low Dose Lung CT every 12 months Age 47-80 years with a 30 pack-year smoking history who still smoke or who have quit within the last 15 years  Screening Labs Routine  Labs: Complete Blood Count (CBC), Complete Metabolic Panel (CMP), Cholesterol (Lipid Panel) Every 6-12 months based on history and medications May be recommended more frequently based on current conditions or previous results Hemoglobin A1c Lab Every 3-12 months based on history and previous results Starting at age 32 or earlier with diagnosis of diabetes, high cholesterol, BMI >26, and/or risk factors Frequent monitoring for patients with diabetes to ensure blood sugar control Thyroid Panel (TSH w/ T3 & T4) Every 6 months based on history, symptoms, and risk factors May be repeated more often if on medication HIV One time testing for all patients 39 and older May be repeated more frequently for patients with increased risk factors or exposure  Hepatitis C One time testing for all patients 2 and older May be repeated more frequently for patients with increased risk factors or exposure Gonorrhea, Chlamydia Every 12 months for all sexually active persons 13-24 years Additional monitoring may be recommended for those who are considered high risk or who have symptoms PSA Men 26-84 years old with risk factors Additional screening may be recommended from age 3-69 based on risk factors, symptoms, and history  Vaccine Recommendations Tetanus Booster All adults every 10 years Flu Vaccine All patients 6 months and older every year COVID Vaccine All patients 12 years and older Initial dosing with booster May recommend additional booster based on age and health history HPV Vaccine 2 doses all  patients age 16-26 Dosing may be considered for patients over 26 Shingles Vaccine (Shingrix) 2 doses all adults 43 years and older Pneumonia (Pneumovax 23) All adults 53 years and older May recommend earlier dosing based on health history Pneumonia (Prevnar 59) All adults 37 years and older Dosed 1 year after Pneumovax 23  Additional Screening, Testing, and Vaccinations may be recommended on an individualized basis based on family history, health history, risk factors, and/or exposure.  __________________________________________________________  Diet Recommendations for All Patients  I recommend that all patients maintain a diet low in saturated fats, carbohydrates, and cholesterol. While this can be challenging at first, it is not impossible and small changes can make big differences.  Things to try: Decreasing the amount of soda, sweet tea, and/or juice to one or less per day and replace with water While water is always the first choice, if you do not like water you may consider adding a water additive without sugar to improve the taste other sugar free drinks Replace potatoes with a brightly colored vegetable at dinner Use healthy oils, such as canola oil or olive oil, instead of butter or hard margarine Limit your bread intake to two pieces or less a day Replace regular pasta with low carb pasta options Bake, broil, or grill foods instead of frying Monitor portion sizes  Eat smaller, more frequent meals throughout the day instead of large meals  An important thing to remember is, if you love foods that are not great for your health, you don't have to give them up completely. Instead, allow these foods to be a reward when you have done well. Allowing yourself to still have special treats every once in a while is a nice way to tell yourself thank you for working hard to keep yourself healthy.   Also remember that every day is a new day. If you have a bad day and "fall off the wagon",  you can still climb right back up and keep moving along on your journey!  We have resources available to help you!  Some websites that may be helpful include: www.http://carter.biz/  Www.VeryWellFit.com _____________________________________________________________  Activity Recommendations for All Patients  I recommend that all adults get at least 20 minutes of moderate physical activity that elevates your heart rate at least 5 days out of the week.  Some examples include: Walking or jogging at a pace that allows you to carry on a conversation Cycling (stationary bike or outdoors) Water aerobics Yoga Weight lifting Dancing If physical limitations prevent you from putting stress on your joints, exercise in a pool or seated in a chair are excellent options.  Do determine your MAXIMUM heart rate for activity: YOUR AGE - 220 = MAX HeartRate   Remember! Do not push yourself too hard.  Start  slowly and build up your pace, speed, weight, time in exercise, etc.  Allow your body to rest between exercise and get good sleep. You will need more water than normal when you are exerting yourself. Do not wait until you are thirsty to drink. Drink with a purpose of getting in at least 8, 8 ounce glasses of water a day plus more depending on how much you exercise and sweat.    If you begin to develop dizziness, chest pain, abdominal pain, jaw pain, shortness of breath, headache, vision changes, lightheadedness, or other concerning symptoms, stop the activity and allow your body to rest. If your symptoms are severe, seek emergency evaluation immediately. If your symptoms are concerning, but not severe, please let us know so that we can recommend further evaluation.   ________________________________________________________________

## 2021-01-26 NOTE — Progress Notes (Signed)
Tollie Eth, DNP, AGNP-c Primary Care & Sports Medicine [address]  [address] [place], [place] [address] [phone]  New patient visit   Patient: Jay Ortiz   DOB: 1986-03-27   34 y.o. Male  MRN: [id number] Visit Date: 01/27/2021  Patient Care Team: Wynston Romey, Sung Amabile, NP as PCP - General (Nurse Practitioner)  Today's healthcare provider: Tollie Eth, NP   Chief Complaint  Patient presents with   Establish Care    Patient states he has been anxious since role change.  Patient is a [employer] employee and would like CPE today.   Subjective    Jay Ortiz is a 35 y.o. male who presents today as a new patient to establish care.  HPI  Increased anxiety with following change of employers.  Reports symptoms are manageable at work, but once he is home his thoughts ruminate.  He does sleep well with use of his CPAP. He denies any symptoms of panic at this time.   Recent weight gain following completion of school and starting new role as a provider Endorses that he does not tend to eat much during the day, but does have increased hunger in the evenings. He does not exercise regularly He has a history of HTN, OSA, and pre-DM.  He has not had labs this year.  He does endorse some symptoms of anxiety. Denies palpitations or GI disturbance.   History of HTN controlled with lisinopril 40mg  daiy Denies headaches, chest pain, shortness of breath, palpitations.  Tolerates medication well with no known SE Generally BP well controlled.   CPE: is a single 35 year old male who works as a 34 for a [employer], [employer] in [place].  He currently lives alone.  He is not currently sexually active and denies any concerns with STI today He is a non-smoker and does not use elicit drugs. He will have an occasional alcoholic drink, less than 1 per week.  He will be getting his flu shot at work this year.   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    Hypersomnia, periodic 07/12/2017   Hypertension    Shifting sleep-work schedule, affecting sleep 07/12/2017   Past Surgical History:  Procedure Laterality Date   TONSILLECTOMY     Family Status  Relation Name Status   Mother  Alive   Father  Alive   MGM  (Not Specified)   MGF  (Not Specified)   PGM  (Not Specified)   PGF  (Not Specified)   Family History  Problem Relation Age of Onset   Ulcers Mother    Hypertension Father    Hyperlipidemia Father    CVA Maternal Grandmother    Diabetes Mellitus II Maternal Grandfather    Colon cancer Paternal Grandmother    Heart attack Paternal Grandfather    Social History   Socioeconomic History   Marital status: Single    Spouse name: Not on file   Number of children: Not on file   Years of education: Not on file   Highest education level: Not on file  Occupational History   Not on file  Tobacco Use   Smoking status: Never   Smokeless tobacco: Never  Vaping Use   Vaping Use: Never used  Substance and Sexual Activity   Alcohol use: Yes    Comment: socially   Drug use: No  Sexual activity: Not on file  Other Topics Concern   Not on file  Social History Narrative   Not on file   Social Determinants of Health   Financial Resource Strain: Not on file  Food Insecurity: Not on file  Transportation Needs: Not on file  Physical Activity: Not on file  Stress: Not on file  Social Connections: Not on file   Outpatient Medications Prior to Visit  Medication Sig   [DISCONTINUED] Cholecalciferol (VITAMIN D3) 1.25 MG (50000 UT) CAPS Take 1 tablet by mouth once a week.   [DISCONTINUED] levocetirizine (XYZAL) 5 MG tablet Take 5 mg by mouth daily.   [DISCONTINUED] lisinopril (ZESTRIL) 40 MG tablet Take 40 mg by mouth daily.   [DISCONTINUED] fexofenadine (ALLEGRA) 60 MG tablet Take 60 mg by mouth as needed for allergies or rhinitis.   [DISCONTINUED]  VYVANSE 50 MG capsule Take 50 mg by mouth daily.   No facility-administered medications prior to visit.   No Known Allergies  Immunization History  Administered Date(s) Administered   Influenza Inj Mdck Quad Pf 01/11/2018    Health Maintenance  Topic Date Due   COVID-19 Vaccine (1) Never done   HIV Screening  Never done   Hepatitis C Screening  Never done   TETANUS/TDAP  Never done   INFLUENZA VACCINE  02/01/2022 (Originally 12/01/2020)   HPV VACCINES  Aged Out    Patient Care Team: Breelynn Bankert, Sung Amabile, NP as PCP - General (Nurse Practitioner)  Review of Systems All review of systems negative except what is listed in the HPI    Objective    BP 139/66   Pulse 65   Ht 5\' 9"  (1.753 m)   Wt 256 lb 6.4 oz (116.3 kg)   SpO2 99%   BMI 37.86 kg/m  Physical Exam Vitals and nursing note reviewed.  Constitutional:      Appearance: Normal appearance. He is obese.  HENT:     Head: Normocephalic.     Right Ear: Tympanic membrane, ear canal and external ear normal.     Left Ear: Tympanic membrane, ear canal and external ear normal.     Nose: Nose normal.     Mouth/Throat:     Mouth: Mucous membranes are moist.     Pharynx: Oropharynx is clear.  Eyes:     Extraocular Movements: Extraocular movements intact.     Conjunctiva/sclera: Conjunctivae normal.     Pupils: Pupils are equal, round, and reactive to light.  Cardiovascular:     Rate and Rhythm: Normal rate and regular rhythm.     Pulses: Normal pulses.     Heart sounds: Normal heart sounds. No murmur heard. Pulmonary:     Effort: Pulmonary effort is normal. No respiratory distress.     Breath sounds: Normal breath sounds. No wheezing.  Abdominal:     General: Bowel sounds are normal. There is no distension.     Palpations: Abdomen is soft. There is no mass.     Tenderness: There is no abdominal tenderness. There is no right CVA tenderness, left CVA tenderness, guarding or rebound.     Hernia: No hernia is present.   Musculoskeletal:        General: Normal range of motion.     Cervical back: Normal range of motion. No tenderness.     Right lower leg: No edema.     Left lower leg: No edema.  Lymphadenopathy:     Cervical: No cervical adenopathy.  Skin:    General: Skin  is warm and dry.     Capillary Refill: Capillary refill takes less than 2 seconds.  Neurological:     General: No focal deficit present.     Mental Status: He is alert and oriented to person, place, and time.     Cranial Nerves: No cranial nerve deficit.     Sensory: No sensory deficit.     Motor: No weakness.     Coordination: Coordination normal.     Gait: Gait normal.  Psychiatric:        Mood and Affect: Mood normal.        Behavior: Behavior normal.        Thought Content: Thought content normal.        Judgment: Judgment normal.     Depression Screen PHQ 2/9 Scores 01/27/2021  PHQ - 2 Score 0  PHQ- 9 Score 2   No results found for any visits on 01/27/21.  Assessment & Plan      Problem List Items Addressed This Visit     OSA on CPAP    Well controlled  Continue CPAP for sleep.       Encounter for medical examination to establish care - Primary    Review of current and past medical history, social history, medication, and family history.  Review of care gaps and health maintenance recommendations.  Records from recent providers to be requested if not available in Chart Review or Care Everywhere.  Recommendations for health maintenance, diet, and exercise provided.  Labs today: CPE labs HM Recommendations: Flu and COVID vaccinations discussed CPE due: Done today       Primary hypertension    BP slightly elevated in office today Patient new to practice and suspect this is due to white coat HTN- generally well controlled No alarm symptoms present today Recommend at home monitoring and F/U if BP remains above 130/85 for further evaluation Will obtain labs today      Relevant Medications   lisinopril  (ZESTRIL) 40 MG tablet   Other Relevant Orders   CBC With Differential   Comprehensive metabolic panel   Lipid panel   TSH   Generalized anxiety disorder    Increased anxiety symptoms related to recent job change Patient interested in hydroxyzine PRN for symptom management Will trial hydroxyzine up to 50mg  per day as needed for anxiety He will follow-up if he finds this ineffective No alarm signs present today Given recent weight gain and anxiety- will check thyroid levels today      Relevant Medications   hydrOXYzine (VISTARIL) 25 MG capsule   Other Relevant Orders   TSH   Seasonal allergic rhinitis    Well controlled on nightly Xyzal 5mg  No changes to plan to day Refills provided.       Relevant Medications   levocetirizine (XYZAL) 5 MG tablet   Weight gain    Reported increased weight gain over past several years.  Has tried medication management in the past with no real success- unable to tolerate GLP Will monitor labs today Recommendations for diet and exercise provided for patient with instructions to follow-up if additional treatments are requested.       Relevant Orders   TSH   Other Visit Diagnoses     Laboratory tests ordered as part of a complete physical exam (CPE)       Relevant Orders   CBC With Differential   Comprehensive metabolic panel   Lipid panel   TSH   Screening for deficiency anemia  Relevant Orders   CBC With Differential   Screening for endocrine, nutritional, metabolic and immunity disorder       Relevant Orders   Comprehensive metabolic panel   Lipid panel   TSH   Screening for lipid disorders       Relevant Orders   Lipid panel   Screening for thyroid disorder       Relevant Orders   TSH        Return in about 1 year (around 01/27/2022) for CPE today- CPE with labs in 1 year.      Nadeem Romanoski, Sung Amabile, NP, DNP, AGNP-C Primary Care & Sports Medicine at [facility]

## 2021-01-27 ENCOUNTER — Other Ambulatory Visit (HOSPITAL_BASED_OUTPATIENT_CLINIC_OR_DEPARTMENT_OTHER): Payer: Self-pay

## 2021-01-27 ENCOUNTER — Ambulatory Visit (INDEPENDENT_AMBULATORY_CARE_PROVIDER_SITE_OTHER): Payer: 59 | Admitting: Nurse Practitioner

## 2021-01-27 ENCOUNTER — Encounter (HOSPITAL_BASED_OUTPATIENT_CLINIC_OR_DEPARTMENT_OTHER): Payer: Self-pay | Admitting: Nurse Practitioner

## 2021-01-27 ENCOUNTER — Other Ambulatory Visit: Payer: Self-pay

## 2021-01-27 VITALS — BP 139/66 | HR 65 | Ht 69.0 in | Wt 256.4 lb

## 2021-01-27 DIAGNOSIS — F411 Generalized anxiety disorder: Secondary | ICD-10-CM

## 2021-01-27 DIAGNOSIS — Z13220 Encounter for screening for lipoid disorders: Secondary | ICD-10-CM | POA: Diagnosis not present

## 2021-01-27 DIAGNOSIS — J302 Other seasonal allergic rhinitis: Secondary | ICD-10-CM | POA: Diagnosis not present

## 2021-01-27 DIAGNOSIS — I10 Essential (primary) hypertension: Secondary | ICD-10-CM

## 2021-01-27 DIAGNOSIS — Z130 Encounter for screening for diseases of the blood and blood-forming organs and certain disorders involving the immune mechanism: Secondary | ICD-10-CM | POA: Diagnosis not present

## 2021-01-27 DIAGNOSIS — Z1321 Encounter for screening for nutritional disorder: Secondary | ICD-10-CM | POA: Diagnosis not present

## 2021-01-27 DIAGNOSIS — Z0000 Encounter for general adult medical examination without abnormal findings: Secondary | ICD-10-CM

## 2021-01-27 DIAGNOSIS — G4733 Obstructive sleep apnea (adult) (pediatric): Secondary | ICD-10-CM

## 2021-01-27 DIAGNOSIS — Z13228 Encounter for screening for other metabolic disorders: Secondary | ICD-10-CM | POA: Diagnosis not present

## 2021-01-27 DIAGNOSIS — R635 Abnormal weight gain: Secondary | ICD-10-CM | POA: Diagnosis not present

## 2021-01-27 DIAGNOSIS — Z9989 Dependence on other enabling machines and devices: Secondary | ICD-10-CM

## 2021-01-27 DIAGNOSIS — Z1329 Encounter for screening for other suspected endocrine disorder: Secondary | ICD-10-CM

## 2021-01-27 HISTORY — DX: Encounter for general adult medical examination without abnormal findings: Z00.00

## 2021-01-27 MED ORDER — HYDROXYZINE PAMOATE 25 MG PO CAPS
25.0000 mg | ORAL_CAPSULE | Freq: Two times a day (BID) | ORAL | 6 refills | Status: DC | PRN
  Filled 2021-01-27: qty 60, 30d supply, fill #0
  Filled 2021-05-20: qty 60, 30d supply, fill #1

## 2021-01-27 MED ORDER — LISINOPRIL 40 MG PO TABS
40.0000 mg | ORAL_TABLET | Freq: Every day | ORAL | 3 refills | Status: DC
  Filled 2021-01-27: qty 90, 90d supply, fill #0
  Filled 2021-05-20: qty 90, 90d supply, fill #1
  Filled 2021-09-01: qty 90, 90d supply, fill #2
  Filled 2021-11-25: qty 90, 90d supply, fill #3

## 2021-01-27 MED ORDER — LEVOCETIRIZINE DIHYDROCHLORIDE 5 MG PO TABS
5.0000 mg | ORAL_TABLET | Freq: Every evening | ORAL | 3 refills | Status: DC
  Filled 2021-01-27 – 2021-02-24 (×2): qty 90, 90d supply, fill #0
  Filled 2021-05-20: qty 90, 90d supply, fill #1
  Filled 2021-09-01: qty 90, 90d supply, fill #2
  Filled 2021-11-25: qty 90, 90d supply, fill #3

## 2021-01-27 NOTE — Assessment & Plan Note (Signed)
Review of current and past medical history, social history, medication, and family history.  Review of care gaps and health maintenance recommendations.  Records from recent providers to be requested if not available in Chart Review or Care Everywhere.  Recommendations for health maintenance, diet, and exercise provided.  Labs today: CPE labs HM Recommendations: Flu and COVID vaccinations discussed CPE due: Done today

## 2021-01-27 NOTE — Assessment & Plan Note (Signed)
Well controlled on nightly Xyzal 5mg  No changes to plan to day Refills provided.

## 2021-01-27 NOTE — Assessment & Plan Note (Signed)
Reported increased weight gain over past several years.  Has tried medication management in the past with no real success- unable to tolerate GLP Will monitor labs today Recommendations for diet and exercise provided for patient with instructions to follow-up if additional treatments are requested.

## 2021-01-27 NOTE — Assessment & Plan Note (Signed)
BP slightly elevated in office today Patient new to practice and suspect this is due to white coat HTN- generally well controlled No alarm symptoms present today Recommend at home monitoring and F/U if BP remains above 130/85 for further evaluation Will obtain labs today

## 2021-01-27 NOTE — Assessment & Plan Note (Addendum)
Increased anxiety symptoms related to recent job change Patient interested in hydroxyzine PRN for symptom management Will trial hydroxyzine up to 50mg  per day as needed for anxiety He will follow-up if he finds this ineffective No alarm signs present today Given recent weight gain and anxiety- will check thyroid levels today

## 2021-01-27 NOTE — Assessment & Plan Note (Signed)
Well controlled  Continue CPAP for sleep.

## 2021-01-28 LAB — LIPID PANEL
Chol/HDL Ratio: 5.2 ratio — ABNORMAL HIGH (ref 0.0–5.0)
Cholesterol, Total: 193 mg/dL (ref 100–199)
HDL: 37 mg/dL — ABNORMAL LOW (ref >39)
LDL Chol Calc (NIH): 128 mg/dL — ABNORMAL HIGH (ref 0–99)
Triglycerides: 158 mg/dL — ABNORMAL HIGH (ref 0–149)
VLDL Cholesterol Cal: 28 mg/dL (ref 5–40)

## 2021-01-28 LAB — CBC WITH DIFFERENTIAL
Basophils Absolute: 0.1 10*3/uL (ref 0.0–0.2)
Basos: 1 %
EOS (ABSOLUTE): 0.2 10*3/uL (ref 0.0–0.4)
Eos: 2 %
Hematocrit: 45 % (ref 37.5–51.0)
Hemoglobin: 15.2 g/dL (ref 13.0–17.7)
Immature Grans (Abs): 0.1 10*3/uL (ref 0.0–0.1)
Immature Granulocytes: 1 %
Lymphocytes Absolute: 2.8 10*3/uL (ref 0.7–3.1)
Lymphs: 24 %
MCH: 29.2 pg (ref 26.6–33.0)
MCHC: 33.8 g/dL (ref 31.5–35.7)
MCV: 87 fL (ref 79–97)
Monocytes Absolute: 0.8 10*3/uL (ref 0.1–0.9)
Monocytes: 7 %
Neutrophils Absolute: 7.6 10*3/uL — ABNORMAL HIGH (ref 1.4–7.0)
Neutrophils: 65 %
RBC: 5.2 x10E6/uL (ref 4.14–5.80)
RDW: 13 % (ref 11.6–15.4)
WBC: 11.6 10*3/uL — ABNORMAL HIGH (ref 3.4–10.8)

## 2021-01-28 LAB — COMPREHENSIVE METABOLIC PANEL
ALT: 20 IU/L (ref 0–44)
AST: 20 IU/L (ref 0–40)
Albumin/Globulin Ratio: 2.5 — ABNORMAL HIGH (ref 1.2–2.2)
Albumin: 4.9 g/dL (ref 4.0–5.0)
Alkaline Phosphatase: 46 IU/L (ref 44–121)
BUN/Creatinine Ratio: 10 (ref 9–20)
BUN: 10 mg/dL (ref 6–20)
Bilirubin Total: 0.6 mg/dL (ref 0.0–1.2)
CO2: 19 mmol/L — ABNORMAL LOW (ref 20–29)
Calcium: 10 mg/dL (ref 8.7–10.2)
Chloride: 98 mmol/L (ref 96–106)
Creatinine, Ser: 0.98 mg/dL (ref 0.76–1.27)
Globulin, Total: 2 g/dL (ref 1.5–4.5)
Glucose: 77 mg/dL (ref 70–99)
Potassium: 4.5 mmol/L (ref 3.5–5.2)
Sodium: 138 mmol/L (ref 134–144)
Total Protein: 6.9 g/dL (ref 6.0–8.5)
eGFR: 105 mL/min/{1.73_m2} (ref >59)

## 2021-01-28 LAB — TSH: TSH: 1.51 u[IU]/mL (ref 0.450–4.500)

## 2021-01-31 ENCOUNTER — Encounter (HOSPITAL_BASED_OUTPATIENT_CLINIC_OR_DEPARTMENT_OTHER): Payer: Self-pay | Admitting: Nurse Practitioner

## 2021-02-24 ENCOUNTER — Other Ambulatory Visit (HOSPITAL_BASED_OUTPATIENT_CLINIC_OR_DEPARTMENT_OTHER): Payer: Self-pay

## 2021-05-20 ENCOUNTER — Other Ambulatory Visit (HOSPITAL_BASED_OUTPATIENT_CLINIC_OR_DEPARTMENT_OTHER): Payer: Self-pay

## 2021-07-07 ENCOUNTER — Encounter (HOSPITAL_BASED_OUTPATIENT_CLINIC_OR_DEPARTMENT_OTHER): Payer: Self-pay | Admitting: Nurse Practitioner

## 2021-07-10 ENCOUNTER — Other Ambulatory Visit (HOSPITAL_BASED_OUTPATIENT_CLINIC_OR_DEPARTMENT_OTHER): Payer: Self-pay | Admitting: Nurse Practitioner

## 2021-07-10 DIAGNOSIS — J301 Allergic rhinitis due to pollen: Secondary | ICD-10-CM

## 2021-07-10 MED ORDER — FLUTICASONE PROPIONATE 50 MCG/ACT NA SUSP: 2.0000 | Freq: Every day | NASAL | 6 refills | Status: DC

## 2021-07-14 ENCOUNTER — Other Ambulatory Visit (HOSPITAL_BASED_OUTPATIENT_CLINIC_OR_DEPARTMENT_OTHER): Payer: Self-pay | Admitting: Nurse Practitioner

## 2021-07-14 DIAGNOSIS — R635 Abnormal weight gain: Secondary | ICD-10-CM

## 2021-07-14 DIAGNOSIS — Z6837 Body mass index (BMI) 37.0-37.9, adult: Secondary | ICD-10-CM

## 2021-07-14 DIAGNOSIS — G4733 Obstructive sleep apnea (adult) (pediatric): Secondary | ICD-10-CM

## 2021-07-14 DIAGNOSIS — I10 Essential (primary) hypertension: Secondary | ICD-10-CM

## 2021-07-14 NOTE — Progress Notes (Signed)
Amb re 

## 2021-09-01 ENCOUNTER — Other Ambulatory Visit (HOSPITAL_BASED_OUTPATIENT_CLINIC_OR_DEPARTMENT_OTHER): Payer: Self-pay

## 2021-11-09 DIAGNOSIS — Z0289 Encounter for other administrative examinations: Secondary | ICD-10-CM

## 2021-11-24 ENCOUNTER — Ambulatory Visit (INDEPENDENT_AMBULATORY_CARE_PROVIDER_SITE_OTHER): Payer: 59 | Admitting: Family Medicine

## 2021-11-24 ENCOUNTER — Encounter (INDEPENDENT_AMBULATORY_CARE_PROVIDER_SITE_OTHER): Payer: Self-pay | Admitting: Family Medicine

## 2021-11-24 VITALS — BP 115/75 | HR 65 | Temp 98.5°F | Ht 70.0 in | Wt 256.0 lb

## 2021-11-24 DIAGNOSIS — Z6836 Body mass index (BMI) 36.0-36.9, adult: Secondary | ICD-10-CM

## 2021-11-24 DIAGNOSIS — R0602 Shortness of breath: Secondary | ICD-10-CM | POA: Diagnosis not present

## 2021-11-24 DIAGNOSIS — R7303 Prediabetes: Secondary | ICD-10-CM | POA: Diagnosis not present

## 2021-11-24 DIAGNOSIS — G4733 Obstructive sleep apnea (adult) (pediatric): Secondary | ICD-10-CM | POA: Diagnosis not present

## 2021-11-24 DIAGNOSIS — Z1331 Encounter for screening for depression: Secondary | ICD-10-CM | POA: Diagnosis not present

## 2021-11-24 DIAGNOSIS — R5383 Other fatigue: Secondary | ICD-10-CM

## 2021-11-24 DIAGNOSIS — I10 Essential (primary) hypertension: Secondary | ICD-10-CM | POA: Diagnosis not present

## 2021-11-25 ENCOUNTER — Other Ambulatory Visit (HOSPITAL_BASED_OUTPATIENT_CLINIC_OR_DEPARTMENT_OTHER): Payer: Self-pay

## 2021-11-25 ENCOUNTER — Encounter (INDEPENDENT_AMBULATORY_CARE_PROVIDER_SITE_OTHER): Payer: Self-pay | Admitting: Family Medicine

## 2021-11-25 DIAGNOSIS — E559 Vitamin D deficiency, unspecified: Secondary | ICD-10-CM | POA: Insufficient documentation

## 2021-11-30 ENCOUNTER — Encounter (INDEPENDENT_AMBULATORY_CARE_PROVIDER_SITE_OTHER): Payer: Self-pay | Admitting: Family Medicine

## 2021-11-30 DIAGNOSIS — E785 Hyperlipidemia, unspecified: Secondary | ICD-10-CM | POA: Insufficient documentation

## 2021-11-30 LAB — CBC
Hematocrit: 47.7 % (ref 37.5–51.0)
Hemoglobin: 16.1 g/dL (ref 13.0–17.7)
MCH: 29.7 pg (ref 26.6–33.0)
MCHC: 33.8 g/dL (ref 31.5–35.7)
MCV: 88 fL (ref 79–97)
Platelets: 297 10*3/uL (ref 150–450)
RBC: 5.42 x10E6/uL (ref 4.14–5.80)
RDW: 13.3 % (ref 11.6–15.4)
WBC: 8.9 10*3/uL (ref 3.4–10.8)

## 2021-11-30 LAB — HEMOGLOBIN A1C
Est. average glucose Bld gHb Est-mCnc: 114 mg/dL
Hgb A1c MFr Bld: 5.6 % (ref 4.8–5.6)

## 2021-11-30 LAB — COMPREHENSIVE METABOLIC PANEL
ALT: 22 IU/L (ref 0–44)
AST: 21 IU/L (ref 0–40)
Albumin/Globulin Ratio: 2 (ref 1.2–2.2)
Albumin: 4.7 g/dL (ref 4.1–5.1)
Alkaline Phosphatase: 42 IU/L — ABNORMAL LOW (ref 44–121)
BUN/Creatinine Ratio: 14 (ref 9–20)
BUN: 14 mg/dL (ref 6–20)
Bilirubin Total: 0.6 mg/dL (ref 0.0–1.2)
CO2: 23 mmol/L (ref 20–29)
Calcium: 9.8 mg/dL (ref 8.7–10.2)
Chloride: 99 mmol/L (ref 96–106)
Creatinine, Ser: 0.98 mg/dL (ref 0.76–1.27)
Globulin, Total: 2.3 g/dL (ref 1.5–4.5)
Glucose: 82 mg/dL (ref 70–99)
Potassium: 4.5 mmol/L (ref 3.5–5.2)
Sodium: 137 mmol/L (ref 134–144)
Total Protein: 7 g/dL (ref 6.0–8.5)
eGFR: 104 mL/min/{1.73_m2} (ref >59)

## 2021-11-30 LAB — LIPID PANEL
Chol/HDL Ratio: 5.9 ratio — ABNORMAL HIGH (ref 0.0–5.0)
Cholesterol, Total: 205 mg/dL — ABNORMAL HIGH (ref 100–199)
HDL: 35 mg/dL — ABNORMAL LOW (ref >39)
LDL Chol Calc (NIH): 141 mg/dL — ABNORMAL HIGH (ref 0–99)
Triglycerides: 159 mg/dL — ABNORMAL HIGH (ref 0–149)
VLDL Cholesterol Cal: 29 mg/dL (ref 5–40)

## 2021-11-30 LAB — VITAMIN D 25 HYDROXY (VIT D DEFICIENCY, FRACTURES): Vit D, 25-Hydroxy: 18.5 ng/mL — ABNORMAL LOW (ref 30.0–100.0)

## 2021-11-30 LAB — INSULIN, FREE AND TOTAL
Free Insulin: 21 uU/mL — ABNORMAL HIGH
Total Insulin: 21 uU/mL

## 2021-11-30 LAB — TSH+FREE T4
Free T4: 1.15 ng/dL (ref 0.82–1.77)
TSH: 1.6 u[IU]/mL (ref 0.450–4.500)

## 2021-11-30 LAB — VITAMIN B12: Vitamin B-12: 549 pg/mL (ref 232–1245)

## 2021-12-07 NOTE — Progress Notes (Unsigned)
Chief Complaint:   OBESITY Jay Ortiz (MR# [id number]) is a 36 y.o. male who presents for evaluation and treatment of obesity and related comorbidities. Current BMI is Body mass index is 36.73 kg/m. Jay Ortiz has been struggling with his weight for many years and has been unsuccessful in either losing weight, maintaining weight loss, or reaching his healthy weight goal.  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  Was overweight as a child.  That was overweight.  Lives alone, and would like a 60 pound weight loss.  Has considered bariatric surgery.  Stress eater, all or nothing mentality.  Snacks at night on junk food.  Overeats with meals, and mindlessly eats: Usually skips breakfast.  Try Ozempic and Saxenda, both were very constipating.  Jay Ortiz is currently in the action stage of change and ready to dedicate time achieving and maintaining a healthier weight. [name] is interested in becoming our patient and working on intensive lifestyle modifications including (but not limited to) diet and exercise for weight loss.  Jay Ortiz's habits were reviewed today and are as follows: his desired weight loss is 56 lbs, he has been heavy most of his life, he started gaining weight slowly in his early 60s, his heaviest weight ever was 260 pounds, he has significant food cravings issues, he snacks frequently in the evenings, he skips meals frequently, he is frequently drinking liquids with calories, he frequently makes poor food choices, he frequently eats larger portions than normal, he has binge eating behaviors, and he struggles with emotional eating.  Depression Screen [name]'s Food and Mood (modified PHQ-9) score was 6.     11/24/2021    8:44 AM  Depression screen PHQ 2/9  Decreased Interest 1  Down, Depressed, Hopeless 0  PHQ - 2 Score 1  Altered sleeping 1  Tired, decreased energy 1  Change in appetite 2  Feeling bad or failure about yourself  0  Trouble concentrating 1  Moving slowly or  fidgety/restless 0  Suicidal thoughts 0  PHQ-9 Score 6  Difficult doing work/chores Not difficult at all   Subjective:   1. Other fatigue Jay Ortiz admits to daytime somnolence and denies waking up still tired. Patient has a history of symptoms of daytime fatigue. Red generally gets 9 hours of sleep per night, and states that he has generally restful sleep. Snoring is not present. Apneic episodes are not present. Epworth Sleepiness Score is 8.   2. SOB (shortness of breath) Jay Ortiz notes increasing shortness of breath with exercising and seems to be worsening over time with weight gain. He notes getting out of breath sooner with activity than he used to. This has not gotten worse recently. Jay Ortiz denies shortness of breath at rest or orthopnea.  3. Prediabetes [name] has no recent A1c for review.  Current diet includes excess sugar and lack of exercise.  4. OSA (obstructive sleep apnea) Jay Ortiz has a CPAP, not wearing it for 1 year.  He plans to get back in with sleep medicine.  5. Primary hypertension Jay Ortiz blood pressure is well-controlled.  He is on lisinopril 40 mg daily.  He denies chest pain but notes a family history of hypertension.  Assessment/Plan:   1. Other fatigue Jay Ortiz does feel that his weight is causing his energy to be lower than it should be. Fatigue may be related to obesity, depression or many other causes. Labs will be ordered, and in the meanwhile, [name] will focus on self care including making healthy food choices, increasing physical  activity and focusing on stress reduction.  - EKG 12-Lead - VITAMIN D 25 Hydroxy (Vit-D Deficiency, Fractures) - TSH + free T4 - Vitamin B12 - CBC  2. SOB (shortness of breath) Jay Ortiz does feel that he gets out of breath more easily that he used to when he exercises. Jay Ortiz's shortness of breath appears to be obesity related and exercise induced. He has agreed to work on weight loss and gradually increase exercise to treat his exercise  induced shortness of breath. Will continue to monitor closely.  3. Prediabetes We will check labs today.  Jay Ortiz will begin to decrease his intake of sugar and increase walking to 2+ days per week.   - Insulin, Free and Total - Hemoglobin A1c  4. OSA (obstructive sleep apnea) Jay Ortiz plans to start CPAP nightly and work on his weight loss.  5. Primary hypertension We will check labs today, and we will follow-up at Conrado' next office visit.  - Comprehensive metabolic panel - Lipid panel  6. Depression screening Jay Ortiz had a positive depression screening. Depression is commonly associated with obesity and often results in emotional eating behaviors. We will monitor this closely and work on CBT to help improve the non-hunger eating patterns. Referral to Psychology may be required if no improvement is seen as he continues in our clinic.  7. Class 2 severe obesity with serious comorbidity and body mass index (BMI) of 36.0 to 36.9 in adult, unspecified obesity type [facility]) Jay Ortiz is currently in the action stage of change and his goal is to continue with weight loss efforts. I recommend Orla begin the structured treatment plan as follows:  He has agreed to the Category 3 Plan.  Review EKG today, normal sinus rhythm.  Obtain labs today.  Exercise goals: Walking at the park 2 times per week.  Behavioral modification strategies: increasing lean protein intake, increasing water intake, decreasing alcohol intake, decreasing eating out, no skipping meals, and better snacking choices.  He was informed of the importance of frequent follow-up visits to maximize his success with intensive lifestyle modifications for his multiple health conditions. He was informed we would discuss his lab results at his next visit unless there is a critical issue that needs to be addressed sooner. [name] agreed to keep his next visit at the agreed upon time to discuss these results.  Objective:   Blood pressure 115/75,  pulse 65, temperature 98.5 F (36.9 C), height 5\' 10"  (1.778 m), weight 256 lb (116.1 kg), SpO2 97 %. Body mass index is 36.73 kg/m.  EKG: Normal sinus rhythm, rate 62 BPM.  Indirect Calorimeter completed today shows a VO2 of 303 and a REE of 2088.  His calculated basal metabolic rate is 2089 thus his basal metabolic rate is worse than expected.  General: Cooperative, alert, well developed, in no acute distress. HEENT: Conjunctivae and lids unremarkable. Cardiovascular: Regular rhythm.  Lungs: Normal work of breathing. Neurologic: No focal deficits.   Lab Results  Component Value Date   CREATININE 0.98 11/24/2021   BUN 14 11/24/2021   NA 137 11/24/2021   K 4.5 11/24/2021   CL 99 11/24/2021   CO2 23 11/24/2021   Lab Results  Component Value Date   ALT 22 11/24/2021   AST 21 11/24/2021   ALKPHOS 42 (L) 11/24/2021   BILITOT 0.6 11/24/2021   Lab Results  Component Value Date   HGBA1C 5.6 11/24/2021   No results found for: "INSULIN" Lab Results  Component Value Date   TSH 1.600 11/24/2021  Lab Results  Component Value Date   CHOL 205 (H) 11/24/2021   HDL 35 (L) 11/24/2021   LDLCALC 141 (H) 11/24/2021   TRIG 159 (H) 11/24/2021   CHOLHDL 5.9 (H) 11/24/2021   Lab Results  Component Value Date   WBC 8.9 11/24/2021   HGB 16.1 11/24/2021   HCT 47.7 11/24/2021   MCV 88 11/24/2021   PLT 297 11/24/2021   No results found for: "IRON", "TIBC", "FERRITIN"  Attestation Statements:   Reviewed by clinician on day of visit: allergies, medications, problem list, medical history, surgical history, family history, social history, and previous encounter notes.  Trude Mcburney, am acting as transcriptionist for Seymour Bars, DO.  I have reviewed the above documentation for accuracy and completeness, and I agree with the above. - ***

## 2021-12-08 ENCOUNTER — Encounter (INDEPENDENT_AMBULATORY_CARE_PROVIDER_SITE_OTHER): Payer: Self-pay | Admitting: Family Medicine

## 2021-12-08 ENCOUNTER — Other Ambulatory Visit (HOSPITAL_BASED_OUTPATIENT_CLINIC_OR_DEPARTMENT_OTHER): Payer: Self-pay

## 2021-12-08 ENCOUNTER — Ambulatory Visit (INDEPENDENT_AMBULATORY_CARE_PROVIDER_SITE_OTHER): Payer: 59 | Admitting: Family Medicine

## 2021-12-08 VITALS — BP 121/71 | HR 80 | Temp 98.7°F | Ht 70.0 in | Wt 249.0 lb

## 2021-12-08 DIAGNOSIS — E7849 Other hyperlipidemia: Secondary | ICD-10-CM

## 2021-12-08 DIAGNOSIS — E669 Obesity, unspecified: Secondary | ICD-10-CM

## 2021-12-08 DIAGNOSIS — E8881 Metabolic syndrome: Secondary | ICD-10-CM | POA: Diagnosis not present

## 2021-12-08 DIAGNOSIS — E559 Vitamin D deficiency, unspecified: Secondary | ICD-10-CM

## 2021-12-08 DIAGNOSIS — G4733 Obstructive sleep apnea (adult) (pediatric): Secondary | ICD-10-CM

## 2021-12-08 DIAGNOSIS — Z9989 Dependence on other enabling machines and devices: Secondary | ICD-10-CM | POA: Diagnosis not present

## 2021-12-08 DIAGNOSIS — Z6835 Body mass index (BMI) 35.0-35.9, adult: Secondary | ICD-10-CM

## 2021-12-08 DIAGNOSIS — E66812 Obesity, class 2: Secondary | ICD-10-CM

## 2021-12-08 DIAGNOSIS — E88819 Insulin resistance, unspecified: Secondary | ICD-10-CM

## 2021-12-08 MED ORDER — VITAMIN D (ERGOCALCIFEROL) 1.25 MG (50000 UNIT) PO CAPS
50000.0000 [IU] | ORAL_CAPSULE | ORAL | 0 refills | Status: DC
  Filled 2021-12-08: qty 5, 35d supply, fill #0

## 2021-12-09 ENCOUNTER — Encounter (INDEPENDENT_AMBULATORY_CARE_PROVIDER_SITE_OTHER): Payer: Self-pay

## 2021-12-15 NOTE — Progress Notes (Unsigned)
     Chief Complaint:   OBESITY Jay Ortiz is here to discuss his progress with his obesity treatment plan along with follow-up of his obesity related diagnoses. Jay Ortiz is on {MWMwtlossportion/plan2:23431} and states he is following his eating plan approximately ***% of the time. Jay Ortiz states he is *** *** minutes *** times per week.  Today's visit was #: *** Starting weight: *** Starting date: *** Today's weight: *** Today's date: 12/08/2021 Total lbs lost to date: *** Total lbs lost since last in-office visit: ***  Interim History: ***  Subjective:   1. Vitamin D deficiency ***  2. Other hyperlipidemia ***  3. Insulin resistance ***  4. OSA on CPAP ***  Assessment/Plan:   1. Vitamin D deficiency *** - Vitamin D, Ergocalciferol, (DRISDOL) 1.25 MG (50000 UNIT) CAPS capsule; Take 1 capsule (50,000 Units total) by mouth every 7 (seven) days.  Dispense: 5 capsule; Refill: 0  2. Other hyperlipidemia ***  3. Insulin resistance ***  4. OSA on CPAP ***  5. Obesity, current BMI 35.8 Jay Ortiz {CHL AMB IS/IS NOT:[id number]} currently in the action stage of change. As such, his goal is to {MWMwtloss#1:210800005}. He has agreed to {MWMwtlossportion/plan2:23431}.   Exercise goals: {MWM EXERCISE RECS:23473}  Behavioral modification strategies: {MWMwtlossdietstrategies3:23432}.  Jay Ortiz has agreed to follow-up with our clinic in {NUMBER 1-10:22536} weeks. He was informed of the importance of frequent follow-up visits to maximize his success with intensive lifestyle modifications for his multiple health conditions.   Objective:   Blood pressure 121/71, pulse 80, temperature 98.7 F (37.1 C), height 5\' 10"  (1.778 m), weight 249 lb (112.9 kg), SpO2 97 %. Body mass index is 35.73 kg/m.  General: Cooperative, alert, well developed, in no acute distress. HEENT: Conjunctivae and lids unremarkable. Cardiovascular: Regular rhythm.  Lungs: Normal work of breathing. Neurologic: No focal  deficits.   Lab Results  Component Value Date   CREATININE 0.98 11/24/2021   BUN 14 11/24/2021   NA 137 11/24/2021   K 4.5 11/24/2021   CL 99 11/24/2021   CO2 23 11/24/2021   Lab Results  Component Value Date   ALT 22 11/24/2021   AST 21 11/24/2021   ALKPHOS 42 (L) 11/24/2021   BILITOT 0.6 11/24/2021   Lab Results  Component Value Date   HGBA1C 5.6 11/24/2021   No results found for: "INSULIN" Lab Results  Component Value Date   TSH 1.600 11/24/2021   Lab Results  Component Value Date   CHOL 205 (H) 11/24/2021   HDL 35 (L) 11/24/2021   LDLCALC 141 (H) 11/24/2021   TRIG 159 (H) 11/24/2021   CHOLHDL 5.9 (H) 11/24/2021   Lab Results  Component Value Date   VD25OH 18.5 (L) 11/24/2021   Lab Results  Component Value Date   WBC 8.9 11/24/2021   HGB 16.1 11/24/2021   HCT 47.7 11/24/2021   MCV 88 11/24/2021   PLT 297 11/24/2021   No results found for: "IRON", "TIBC", "FERRITIN"  Attestation Statements:   Reviewed by clinician on day of visit: allergies, medications, problem list, medical history, surgical history, family history, social history, and previous encounter notes.   11/26/2021, am acting as transcriptionist for Trude Mcburney, DO.  I have reviewed the above documentation for accuracy and completeness, and I agree with the above. -  ***

## 2021-12-29 ENCOUNTER — Ambulatory Visit (INDEPENDENT_AMBULATORY_CARE_PROVIDER_SITE_OTHER): Payer: 59 | Admitting: Family Medicine

## 2021-12-29 ENCOUNTER — Encounter (INDEPENDENT_AMBULATORY_CARE_PROVIDER_SITE_OTHER): Payer: Self-pay | Admitting: Family Medicine

## 2021-12-29 ENCOUNTER — Other Ambulatory Visit (HOSPITAL_BASED_OUTPATIENT_CLINIC_OR_DEPARTMENT_OTHER): Payer: Self-pay

## 2021-12-29 VITALS — BP 118/69 | HR 67 | Temp 98.2°F | Ht 70.0 in | Wt 243.0 lb

## 2021-12-29 DIAGNOSIS — E8881 Metabolic syndrome: Secondary | ICD-10-CM

## 2021-12-29 DIAGNOSIS — Z6835 Body mass index (BMI) 35.0-35.9, adult: Secondary | ICD-10-CM

## 2021-12-29 DIAGNOSIS — Z9989 Dependence on other enabling machines and devices: Secondary | ICD-10-CM

## 2021-12-29 DIAGNOSIS — E559 Vitamin D deficiency, unspecified: Secondary | ICD-10-CM

## 2021-12-29 DIAGNOSIS — G4733 Obstructive sleep apnea (adult) (pediatric): Secondary | ICD-10-CM

## 2021-12-29 DIAGNOSIS — E669 Obesity, unspecified: Secondary | ICD-10-CM | POA: Diagnosis not present

## 2021-12-29 MED ORDER — VITAMIN D (ERGOCALCIFEROL) 1.25 MG (50000 UNIT) PO CAPS
50000.0000 [IU] | ORAL_CAPSULE | ORAL | 0 refills | Status: DC
  Filled 2021-12-29: qty 15, 105d supply, fill #0
  Filled 2022-01-07: qty 12, 84d supply, fill #0

## 2022-01-07 ENCOUNTER — Other Ambulatory Visit (HOSPITAL_BASED_OUTPATIENT_CLINIC_OR_DEPARTMENT_OTHER): Payer: Self-pay

## 2022-01-11 NOTE — Progress Notes (Unsigned)
Chief Complaint:   OBESITY Jay Ortiz is here to discuss his progress with his obesity treatment plan along with follow-up of his obesity related diagnoses. [name] is on the Category 3 Plan and keeping a food journal and adhering to recommended goals of 1500 calories and 110 grams of protein daily and states he is following his eating plan approximately 100% of the time. [name] states he is walking for 40 minutes 6 times per week.  Today's visit was #: 3 Starting weight: 256 lbs Starting date: 11/24/2021 Today's weight: 243 lbs Today's date: 12/29/2021 Total lbs lost to date: 13 Total lbs lost since last in-office visit: 6  Interim History: [name] is doing well with meals, cooking, and bringing food to work.  He thinks more about food on the weekends.  Denies sugar cravings.  Getting in fruits and veggies.  Avoids sugar sweetened beverages.  Increase daily steps to 8,000 most days.  Plans to add in resistance training.  Subjective:   1. OSA on CPAP Keanen is awaiting a visit with Dr. Vickey Huger.  He is not wearing his CPAP.  He aims for 8 hours of sleep per night.  2. Vitamin D deficiency Jermaine is on prescription vitamin D 50,000 units once weekly.  His energy level is improving, and his last vitamin D level was 18.5.  3. Insulin resistance [name] last fasting insulin was 21.  He has decreased his intake of sugar and increased walking time.  Assessment/Plan:   1. OSA on CPAP [name] is to keep his Neurology visit to restart CPAP.  2. Vitamin D deficiency [name] will continue prescription vitamin D 50,000 units once weekly, and we will refill for 90 days.  We will recheck labs in 2 months.  - Vitamin D, Ergocalciferol, (DRISDOL) 1.25 MG (50000 UNIT) CAPS capsule; Take 1 capsule (50,000 Units total) by mouth every 7 (seven) days.  Dispense: 15 capsule; Refill: 0  3. Insulin resistance We will recheck labs in 2 months.  4. Obesity, current BMI 35.0 [name] is currently in the action stage  of change. As such, his goal is to continue with weight loss efforts. He has agreed to the Category 3 Plan with 110 grams of protein daily.   Exercise goals: > 8,000 steps per day and 3 days/week of resistance training.  Behavioral modification strategies: increasing lean protein intake, increasing water intake, increasing high fiber foods, no skipping meals, meal planning and cooking strategies, ways to avoid boredom eating, and decreasing junk food.  [name] has agreed to follow-up with our clinic in 4 weeks. He was informed of the importance of frequent follow-up visits to maximize his success with intensive lifestyle modifications for his multiple health conditions.   Objective:   Blood pressure 118/69, pulse 67, temperature 98.2 F (36.8 C), height 5\' 10"  (1.778 m), weight 243 lb (110.2 kg), SpO2 96 %. Body mass index is 34.87 kg/m.  General: Cooperative, alert, well developed, in no acute distress. HEENT: Conjunctivae and lids unremarkable. Cardiovascular: Regular rhythm.  Lungs: Normal work of breathing. Neurologic: No focal deficits.   Lab Results  Component Value Date   CREATININE 0.98 11/24/2021   BUN 14 11/24/2021   NA 137 11/24/2021   K 4.5 11/24/2021   CL 99 11/24/2021   CO2 23 11/24/2021   Lab Results  Component Value Date   ALT 22 11/24/2021   AST 21 11/24/2021   ALKPHOS 42 (L) 11/24/2021   BILITOT 0.6 11/24/2021   Lab Results  Component Value Date  HGBA1C 5.6 11/24/2021   No results found for: "INSULIN" Lab Results  Component Value Date   TSH 1.600 11/24/2021   Lab Results  Component Value Date   CHOL 205 (H) 11/24/2021   HDL 35 (L) 11/24/2021   LDLCALC 141 (H) 11/24/2021   TRIG 159 (H) 11/24/2021   CHOLHDL 5.9 (H) 11/24/2021   Lab Results  Component Value Date   VD25OH 18.5 (L) 11/24/2021   Lab Results  Component Value Date   WBC 8.9 11/24/2021   HGB 16.1 11/24/2021   HCT 47.7 11/24/2021   MCV 88 11/24/2021   PLT 297 11/24/2021   No  results found for: "IRON", "TIBC", "FERRITIN"  Attestation Statements:   Reviewed by clinician on day of visit: allergies, medications, problem list, medical history, surgical history, family history, social history, and previous encounter notes.  Time spent on visit including pre-visit chart review and post-visit care and charting was 30 minutes.   Trude Mcburney, am acting as transcriptionist for Seymour Bars, DO.  I have reviewed the above documentation for accuracy and completeness, and I agree with the above. [name], DO

## 2022-01-15 ENCOUNTER — Other Ambulatory Visit (HOSPITAL_BASED_OUTPATIENT_CLINIC_OR_DEPARTMENT_OTHER): Payer: Self-pay

## 2022-01-15 ENCOUNTER — Other Ambulatory Visit (HOSPITAL_BASED_OUTPATIENT_CLINIC_OR_DEPARTMENT_OTHER): Payer: Self-pay | Admitting: Nurse Practitioner

## 2022-01-15 DIAGNOSIS — R112 Nausea with vomiting, unspecified: Secondary | ICD-10-CM

## 2022-01-15 MED ORDER — ONDANSETRON HCL 8 MG PO TABS
8.0000 mg | ORAL_TABLET | Freq: Three times a day (TID) | ORAL | 2 refills | Status: DC | PRN
  Filled 2022-01-15: qty 30, 10d supply, fill #0

## 2022-01-26 ENCOUNTER — Encounter (INDEPENDENT_AMBULATORY_CARE_PROVIDER_SITE_OTHER): Payer: Self-pay | Admitting: Family Medicine

## 2022-01-26 ENCOUNTER — Ambulatory Visit (INDEPENDENT_AMBULATORY_CARE_PROVIDER_SITE_OTHER): Payer: 59 | Admitting: Family Medicine

## 2022-01-26 VITALS — BP 119/80 | HR 73 | Temp 98.5°F | Ht 70.0 in | Wt 241.0 lb

## 2022-01-26 DIAGNOSIS — I10 Essential (primary) hypertension: Secondary | ICD-10-CM | POA: Diagnosis not present

## 2022-01-26 DIAGNOSIS — E8881 Metabolic syndrome: Secondary | ICD-10-CM

## 2022-01-26 DIAGNOSIS — G4733 Obstructive sleep apnea (adult) (pediatric): Secondary | ICD-10-CM | POA: Insufficient documentation

## 2022-01-26 DIAGNOSIS — E88819 Insulin resistance, unspecified: Secondary | ICD-10-CM | POA: Insufficient documentation

## 2022-01-26 DIAGNOSIS — E669 Obesity, unspecified: Secondary | ICD-10-CM

## 2022-01-26 DIAGNOSIS — E559 Vitamin D deficiency, unspecified: Secondary | ICD-10-CM

## 2022-01-26 DIAGNOSIS — Z6834 Body mass index (BMI) 34.0-34.9, adult: Secondary | ICD-10-CM | POA: Diagnosis not present

## 2022-01-26 HISTORY — DX: Obstructive sleep apnea (adult) (pediatric): G47.33

## 2022-01-28 NOTE — Progress Notes (Signed)
Chief Complaint:   OBESITY Jay Ortiz is here to discuss his progress with his obesity treatment plan along with follow-up of his obesity related diagnoses. Jay Ortiz is on Category 3 Plan with 110 grams of protein daily and states he is following his eating plan approximately 75% of the time. Jay Ortiz states he is walking 60 minutes 5-6 times per week.  Today's visit was #: 4 Starting weight: 256 lbs Starting date: 11/24/2021 Today's weight: 241 lbs Today's date: 01/26/2022 Total lbs lost to date: 15 lbs Total lbs lost since last in-office visit: 2 lbs  Interim History: Had a vacation to the [place] last week and got off track with foods and  ETOH.  Thinking about getting a treadmill.  Plans to add resistance training.  Back to walking 1 hour 5-6 times per week.  Denies hunger or cravings.   Subjective:   1. OSA (obstructive sleep apnea) Awaiting Neurology visit to start CPAP.    2. Vitamin D deficiency He is currently taking prescription vitamin D 50,000 IU each week. He denies nausea, vomiting or muscle weakness.  3. Insulin resistance His fasting insulin level was 21.  4. Essential hypertension His blood pressure is well controlled on Lisinopril 40 mg daily. Denies chest pain.    Assessment/Plan:   1. OSA (obstructive sleep apnea) Follow up with Dr Brett Fairy to get started on CPAP.  2. Vitamin D deficiency Continue prescription Vitamin D 50,000 IU weekly.   3. Insulin resistance Recheck level in 2 months.  Continue a low sugar diet.   4. Essential hypertension Continue current blood pressure medications per PCP.   5. Obesity,current BMI 34.6 Jay Ortiz is currently in the action stage of change. As such, his goal is to continue with weight loss efforts. He has agreed to the Category 3 Plan + 110 protein daily.   Exercise goals:  Add resistance training 3 times per week.   Behavioral modification strategies: increasing lean protein intake, increasing water intake,  decreasing eating out, no skipping meals, meal planning and cooking strategies, keeping healthy foods in the home, and decreasing junk food.  Jay Ortiz has agreed to follow-up with our clinic in 3 weeks. He was informed of the importance of frequent follow-up visits to maximize his success with intensive lifestyle modifications for his multiple health conditions.   Objective:   Blood pressure 119/80, pulse 73, temperature 98.5 F (36.9 C), height 5\' 10"  (1.778 m), weight 241 lb (109.3 kg), SpO2 98 %. Body mass index is 34.58 kg/m.  General: Cooperative, alert, well developed, in no acute distress. HEENT: Conjunctivae and lids unremarkable. Cardiovascular: Regular rhythm.  Lungs: Normal work of breathing. Neurologic: No focal deficits.   Lab Results  Component Value Date   CREATININE 0.98 11/24/2021   BUN 14 11/24/2021   NA 137 11/24/2021   K 4.5 11/24/2021   CL 99 11/24/2021   CO2 23 11/24/2021   Lab Results  Component Value Date   ALT 22 11/24/2021   AST 21 11/24/2021   ALKPHOS 42 (L) 11/24/2021   BILITOT 0.6 11/24/2021   Lab Results  Component Value Date   HGBA1C 5.6 11/24/2021   No results found for: "INSULIN" Lab Results  Component Value Date   TSH 1.600 11/24/2021   Lab Results  Component Value Date   CHOL 205 (H) 11/24/2021   HDL 35 (L) 11/24/2021   LDLCALC 141 (H) 11/24/2021   TRIG 159 (H) 11/24/2021   CHOLHDL 5.9 (H) 11/24/2021   Lab Results  Component Value Date   VD25OH 18.5 (L) 11/24/2021   Lab Results  Component Value Date   WBC 8.9 11/24/2021   HGB 16.1 11/24/2021   HCT 47.7 11/24/2021   MCV 88 11/24/2021   PLT 297 11/24/2021   No results found for: "IRON", "TIBC", "FERRITIN"  Attestation Statements:   Reviewed by clinician on day of visit: allergies, medications, problem list, medical history, surgical history, family history, social history, and previous encounter notes.  Time spent on visit including pre-visit chart review and post-visit  care and charting was 30 minutes.   I, Malcolm Metro, am acting as Energy manager for Seymour Bars, DO.  I have reviewed the above documentation for accuracy and completeness, and I agree with the above. [name], DO

## 2022-02-02 ENCOUNTER — Encounter (HOSPITAL_BASED_OUTPATIENT_CLINIC_OR_DEPARTMENT_OTHER): Payer: Self-pay | Admitting: Nurse Practitioner

## 2022-02-02 ENCOUNTER — Ambulatory Visit (INDEPENDENT_AMBULATORY_CARE_PROVIDER_SITE_OTHER): Payer: 59 | Admitting: Nurse Practitioner

## 2022-02-02 VITALS — BP 126/66 | HR 73 | Ht 70.0 in | Wt 243.0 lb

## 2022-02-02 DIAGNOSIS — E88819 Insulin resistance, unspecified: Secondary | ICD-10-CM

## 2022-02-02 DIAGNOSIS — Z0000 Encounter for general adult medical examination without abnormal findings: Secondary | ICD-10-CM | POA: Diagnosis not present

## 2022-02-02 DIAGNOSIS — I10 Essential (primary) hypertension: Secondary | ICD-10-CM | POA: Diagnosis not present

## 2022-02-02 DIAGNOSIS — G4733 Obstructive sleep apnea (adult) (pediatric): Secondary | ICD-10-CM | POA: Diagnosis not present

## 2022-02-02 NOTE — Patient Instructions (Addendum)
Recommendations from today:  No labs needed at this time. These have been reviewed from [facility] I miss you!!  Keep me posted on how things are going.  Increase your beans, nuts, seeds, berries, and whole grains to help increase your good cholesterol.  I recommend a Omega 3 fatty acid for cholesterol

## 2022-02-02 NOTE — Progress Notes (Signed)
BP 126/66   Pulse 73   Ht _0  (1.778 m)   Wt 243 lb (110.2 kg)   SpO2 99%   BMI 34.87 kg/m    Subjective:    Patient ID: Jay Ortiz, male    DOB: 1986-01-12, 36 y.o.   MRN: [id number]  HPI: Jay Ortiz is a 36 y.o. male presenting on 02/02/2022 for comprehensive medical examination.   Recent labs completed with [facility]  Current medical concerns include:none  He reports regular vision exams q1-5y: yes He reports regular dental exams q 49m yes His diet consists of:  healthy options- monitoring closely and working with HBeattyHe endorses exercise and/or activity of:  walking 457m 5 days a week He works as:  [employer], jewelleryHe endorses ETOH use ( social ) He denies nictoine use  He denies illegal substance use  He is not sexually active  He denies concerns today about STI  He denies concerns about skin changes today  He denies concerns about bowel changes today  He denies concerns about bladder changes today   Most Recent Depression Screen:     02/02/2022    3:10 PM 11/24/2021    8:44 AM 01/27/2021    2:40 PM  Depression screen PHQ 2/9  Decreased Interest 0 1 0  Down, Depressed, Hopeless 0 0 0  PHQ - 2 Score 0 1 0  Altered sleeping 0 1 1  Tired, decreased energy 1 1 0  Change in appetite _1 Feeling bad or failure about yourself  1 0 0  Trouble concentrating 1 1 0  Moving slowly or fidgety/restless 0 0 0  Suicidal thoughts 0 0 0  PHQ-9 Score _2 Difficult doing work/chores Not difficult at all Not difficult at all Not difficult at all   Most Recent Anxiety Screen:    02/02/2022    3:11 PM 01/27/2021    2:40 PM  GAD 7 : Generalized Anxiety Score  Nervous, Anxious, on Edge 0 2  Control/stop worrying 0 2  Worry too much - different things 1 1  Trouble relaxing 0 1  Restless 0 0  Easily annoyed or irritable 1 0  Afraid - awful might happen 0 0  Total GAD 7 Score 2 6  Anxiety Difficulty Not difficult at all Somewhat  difficult   Most Recent Falls Screen:    02/02/2022    3:10 PM 01/27/2021    2:40 PM  [place] the past year? 0 0  Number falls in past yr: 0 0  Injury with Fall? 0 0  Risk for fall due to : No Fall Risks No Fall Risks  Follow up Falls evaluation completed;Education provided Falls evaluation completed    Past medical history, surgical history, medications, allergies, family history and social history reviewed with patient today and changes made to appropriate areas of the chart.  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   levocetirizine (XYZAL) 5 MG tablet  Take 1 tablet (5 mg total) by mouth every evening.   lisinopril (ZESTRIL) 40 MG tablet Take 1 tablet (40 mg total) by mouth daily.   ondansetron (ZOFRAN) 8 MG tablet Take 1 tablet (8 mg total) by mouth every 8 (eight) hours as needed for nausea or vomiting.   Vitamin D, Ergocalciferol, (DRISDOL) 1.25 MG (50000 UNIT) CAPS capsule Take 1 capsule (50,000 Units total) by mouth every 7 (seven) days.   fluticasone (FLONASE) 50 MCG/ACT nasal spray Place 2 sprays into both nostrils daily. (Patient not taking: Reported on 02/02/2022)   No current facility-administered medications on file prior to visit.   Surgical History:  Past Surgical History:  Procedure Laterality Date   TONSILLECTOMY     Allergies:  No Known Allergies Social History:  Social History   Socioeconomic History   Marital status: Single    Spouse name: Not on file   Number of children: Not on file   Years of education: Not on  file   Highest education level: Not on file  Occupational History   Not on file  Tobacco Use   Smoking status: Never   Smokeless tobacco: Never  Vaping Use   Vaping Use: Never used  Substance and Sexual Activity   Alcohol use: Yes    Comment: socially   Drug use: No   Sexual activity: Not on file  Other Topics Concern   Not on file  Social History Narrative   Not on file   Social Determinants of Health   Financial Resource Strain: Not on file  Food Insecurity: Not on file  Transportation Needs: Not on file  Physical Activity: Not on file  Stress: Not on file  Social Connections: Not on file  Intimate Partner Violence: Not on file   Social History   Tobacco Use  Smoking Status Never  Smokeless Tobacco Never   Social History   Substance and Sexual Activity  Alcohol Use Yes   Comment: socially   Family History:  Family History  Problem Relation Age of Onset   Ulcers Mother    Hypertension Father    Hyperlipidemia Father    CVA Maternal Grandmother    Diabetes Mellitus II Maternal Grandfather    Colon cancer Paternal Grandmother    Heart attack Paternal Grandfather      All ROS negative except what is listed above and in the HPI.      Objective:    BP 126/66   Pulse 73   Ht _0  (1.778 m)   Wt 243 lb (110.2 kg)   SpO2 99%   BMI 34.87 kg/m   Wt Readings from Last 3 Encounters:  02/02/22 243 lb (110.2 kg)  01/26/22 241 lb (109.3 kg)  12/29/21 243 lb (110.2 kg)    Physical Exam Vitals and nursing note reviewed.  Constitutional:      General: He is not in acute distress.    Appearance: Normal appearance.  HENT:     Head: Normocephalic and atraumatic.     Right Ear: Hearing, tympanic membrane, ear canal and external ear normal.     Left Ear: Hearing, tympanic membrane, ear canal and external ear normal.     Nose: Nose normal.     Right Sinus: No maxillary sinus tenderness or frontal sinus tenderness.     Left Sinus: No maxillary sinus  tenderness or frontal sinus tenderness.     Mouth/Throat:     Lips: Pink.     Mouth: Mucous membranes are moist.  Pharynx: Oropharynx is clear.  Eyes:     General: Lids are normal. Vision grossly intact.     Extraocular Movements: Extraocular movements intact.     Conjunctiva/sclera: Conjunctivae normal.     Pupils: Pupils are equal, round, and reactive to light.     Funduscopic exam:    Right eye: No hemorrhage. Red reflex present.        Left eye: No hemorrhage. Red reflex present.    Visual Fields: Right eye visual fields normal and left eye visual fields normal.  Neck:     Thyroid: No thyromegaly.     Vascular: No carotid bruit or JVD.  Cardiovascular:     Rate and Rhythm: Normal rate and regular rhythm.     Chest Wall: PMI is not displaced.     Pulses: Normal pulses.          Dorsalis pedis pulses are 2+ on the right side and 2+ on the left side.       Posterior tibial pulses are 2+ on the right side and 2+ on the left side.     Heart sounds: Normal heart sounds. No murmur heard. Pulmonary:     Effort: Pulmonary effort is normal. No respiratory distress.     Breath sounds: Normal breath sounds.  Chest:  Breasts:    Breasts are symmetrical.  Abdominal:     General: Bowel sounds are normal. There is no distension or abdominal bruit.     Palpations: Abdomen is soft. There is no hepatomegaly, splenomegaly or mass.     Tenderness: There is no abdominal tenderness. There is no right CVA tenderness, left CVA tenderness, guarding or rebound.  Musculoskeletal:        General: Normal range of motion.     Cervical back: Full passive range of motion without pain and neck supple. No tenderness. No spinous process tenderness or muscular tenderness.     Right lower leg: No edema.     Left lower leg: No edema.  Feet:     Right foot:     Toenail Condition: Right toenails are normal.     Left foot:     Toenail Condition: Left toenails are normal.  Lymphadenopathy:     Cervical: No  cervical adenopathy.     Upper Body:     Right upper body: No supraclavicular adenopathy.     Left upper body: No supraclavicular adenopathy.  Skin:    General: Skin is warm and dry.     Capillary Refill: Capillary refill takes less than 2 seconds.     Nails: There is no clubbing.  Neurological:     General: No focal deficit present.     Mental Status: He is alert and oriented to person, place, and time.     Cranial Nerves: No cranial nerve deficit.     Sensory: Sensation is intact. No sensory deficit.     Motor: Motor function is intact. No weakness.     Coordination: Coordination is intact. Coordination normal.     Gait: Gait is intact. Gait normal.  Psychiatric:        Attention and Perception: Attention normal.        Mood and Affect: Mood normal.        Speech: Speech normal.        Behavior: Behavior normal. Behavior is cooperative.        Thought Content: Thought content normal.        Cognition and Memory: Cognition and memory  normal.        Judgment: Judgment normal.     Results for orders placed or performed in visit on 11/24/21  Comprehensive metabolic panel  Result Value Ref Range   Glucose 82 70 - 99 mg/dL   BUN 14 6 - 20 mg/dL   Creatinine, Ser 0.98 0.76 - 1.27 mg/dL   eGFR 104 >59 mL/min/1.73   BUN/Creatinine Ratio 14 9 - 20   Sodium 137 134 - 144 mmol/L   Potassium 4.5 3.5 - 5.2 mmol/L   Chloride 99 96 - 106 mmol/L   CO2 23 20 - 29 mmol/L   Calcium 9.8 8.7 - 10.2 mg/dL   Total Protein 7.0 6.0 - 8.5 g/dL   Albumin 4.7 4.1 - 5.1 g/dL   Globulin, Total 2.3 1.5 - 4.5 g/dL   Albumin/Globulin Ratio 2.0 1.2 - 2.2   Bilirubin Total 0.6 0.0 - 1.2 mg/dL   Alkaline Phosphatase 42 (L) 44 - 121 IU/L   AST 21 0 - 40 IU/L   ALT 22 0 - 44 IU/L  VITAMIN D 25 Hydroxy (Vit-D Deficiency, Fractures)  Result Value Ref Range   Vit D, 25-Hydroxy 18.5 (L) 30.0 - 100.0 ng/mL  Lipid panel  Result Value Ref Range   Cholesterol, Total 205 (H) 100 - 199 mg/dL   Triglycerides  159 (H) 0 - 149 mg/dL   HDL 35 (L) >39 mg/dL   VLDL Cholesterol Cal 29 5 - 40 mg/dL   LDL Chol Calc (NIH) 141 (H) 0 - 99 mg/dL   Chol/HDL Ratio 5.9 (H) 0.0 - 5.0 ratio  TSH + free T4  Result Value Ref Range   TSH 1.600 0.450 - 4.500 uIU/mL   Free T4 1.15 0.82 - 1.77 ng/dL  Vitamin B12  Result Value Ref Range   Vitamin B-12 549 232 - 1,245 pg/mL  CBC  Result Value Ref Range   WBC 8.9 3.4 - 10.8 x10E3/uL   RBC 5.42 4.14 - 5.80 x10E6/uL   Hemoglobin 16.1 13.0 - 17.7 g/dL   Hematocrit 47.7 37.5 - 51.0 %   MCV 88 79 - 97 fL   MCH 29.7 26.6 - 33.0 pg   MCHC 33.8 31.5 - 35.7 g/dL   RDW 13.3 11.6 - 15.4 %   Platelets 297 150 - 450 x10E3/uL  Insulin, Free and Total  Result Value Ref Range   Free Insulin 21 (H) uU/mL   Total Insulin 21 uU/mL  Hemoglobin A1c  Result Value Ref Range   Hgb A1c MFr Bld 5.6 4.8 - 5.6 %   Est. average glucose Bld gHb Est-mCnc 114 mg/dL      Assessment & Plan:   Problem List Items Addressed This Visit     OSA (obstructive sleep apnea)   Insulin resistance   Essential hypertension   Other Visit Diagnoses     Encounter for annual physical exam    -  Primary   Health care maintenance            Follow up plan: NEXT PREVENTATIVE PHYSICAL DUE IN 1 YEAR. Return in about 1 year (around 02/03/2023) for CPE.  LABORATORY TESTING:  Health maintenance labs ordered today, if applicable.  - STI testing: deferred  IMMUNIZATIONS:   - Tdap: Tetanus vaccination status reviewed: last tetanus booster within 10 years. - Influenza: Up to date - Pneumovax: Not applicable - Prevnar: Not applicable - HPV: Not applicable - Zostavax vaccine: Not applicable  SCREENING: - Colonoscopy: Not applicable  Discussed with patient purpose  of the colonoscopy is to detect colon cancer at curable precancerous or Teretha Chalupa stages  - AAA Screening: Not applicable  - Hearing Test: Not applicable  - Spirometry: Not applicable  - PSA: Not applicable   PATIENT COUNSELING:    For all adult patients, I recommend A well balanced diet low in saturated fats, cholesterol, and moderation in carbohydrates.   This can be as simple as monitoring portion sizes and cutting back on sugary beverages such as soda and juice to start with.    Daily water consumption of at least 64 ounces.  Physical activity at least 180 minutes per week, if just starting out.   This can be as simple as taking the stairs instead of the elevator and walking 2-3 laps around the office  purposefully every day.   STD protection, partner selection, and regular testing if high risk.  Limited consumption of alcoholic beverages if alcohol is consumed.  For women, I recommend no more than 7 alcoholic beverages per week, spread out throughout the week.  Avoid "binge" drinking or consuming large quantities of alcohol in one setting.   Please let me know if you feel you may need help with reduction or quitting alcohol consumption.   Avoidance of nicotine, if used.  Please let me know if you feel you may need help with reduction or quitting nicotine use.   Daily mental health attention.  This can be in the form of 5 minute daily meditation, prayer, journaling, yoga, reflection, etc.   Purposeful attention to your emotions and mental state can significantly improve your overall wellbeing  and  Health.  Please know that I am here to help you with all of your health care goals and am happy to work with you to find a solution that works best for you.  The greatest advice I have received with any changes in life are to take it one step at a time, that even means if all you can focus on is the next 60 seconds, then do that and celebrate your victories.  With any changes in life, you will have set backs, and that is OK. The important thing to remember is, if you have a set back, it is not a failure, it is an opportunity to try again!  Health Maintenance Recommendations Screening Testing Mammogram Every 1 -2  years based on history and risk factors Starting at age 81 Pap Smear Ages 21-39 every 3 years Ages 58-65 every 5 years with HPV testing More frequent testing may be required based on results and history Colon Cancer Screening Every 1-10 years based on test performed, risk factors, and history Starting at age 5 Bone Density Screening Every 2-10 years based on history Starting at age 43 for women Recommendations for men differ based on medication usage, history, and risk factors AAA Screening One time ultrasound Men 63-56 years old who have every smoked Lung Cancer Screening Low Dose Lung CT every 12 months Age 75-80 years with a 30 pack-year smoking history who still smoke or who have quit within the last 15 years  Screening Labs Routine  Labs: Complete Blood Count (CBC), Complete Metabolic Panel (CMP), Cholesterol (Lipid Panel) Every 6-12 months based on history and medications May be recommended more frequently based on current conditions or previous results Hemoglobin A1c Lab Every 3-12 months based on history and previous results Starting at age 71 or earlier with diagnosis of diabetes, high cholesterol, BMI >26, and/or risk factors Frequent monitoring for patients with diabetes  to ensure blood sugar control Thyroid Panel (TSH w/ T3 & T4) Every 6 months based on history, symptoms, and risk factors May be repeated more often if on medication HIV One time testing for all patients 39 and older May be repeated more frequently for patients with increased risk factors or exposure Hepatitis C One time testing for all patients 61 and older May be repeated more frequently for patients with increased risk factors or exposure Gonorrhea, Chlamydia Every 12 months for all sexually active persons 13-24 years Additional monitoring may be recommended for those who are considered high risk or who have symptoms PSA Men 45-43 years old with risk factors Additional screening may be  recommended from age 52-69 based on risk factors, symptoms, and history  Vaccine Recommendations Tetanus Booster All adults every 10 years Flu Vaccine All patients 6 months and older every year COVID Vaccine All patients 12 years and older Initial dosing with booster May recommend additional booster based on age and health history HPV Vaccine 2 doses all patients age 78-26 Dosing may be considered for patients over 26 Shingles Vaccine (Shingrix) 2 doses all adults 106 years and older Pneumonia (Pneumovax 23) All adults 72 years and older May recommend earlier dosing based on health history Pneumonia (Prevnar 27) All adults 52 years and older Dosed 1 year after Pneumovax 23  Additional Screening, Testing, and Vaccinations may be recommended on an individualized basis based on family history, health history, risk factors, and/or exposure.

## 2022-02-09 ENCOUNTER — Ambulatory Visit (INDEPENDENT_AMBULATORY_CARE_PROVIDER_SITE_OTHER): Payer: 59 | Admitting: Neurology

## 2022-02-09 ENCOUNTER — Encounter: Payer: Self-pay | Admitting: Neurology

## 2022-02-09 VITALS — BP 131/85 | HR 65 | Ht 70.0 in | Wt 239.5 lb

## 2022-02-09 DIAGNOSIS — E669 Obesity, unspecified: Secondary | ICD-10-CM | POA: Insufficient documentation

## 2022-02-09 DIAGNOSIS — I10 Essential (primary) hypertension: Secondary | ICD-10-CM | POA: Insufficient documentation

## 2022-02-09 DIAGNOSIS — Z8669 Personal history of other diseases of the nervous system and sense organs: Secondary | ICD-10-CM | POA: Diagnosis not present

## 2022-02-09 NOTE — Progress Notes (Signed)
SLEEP MEDICINE CLINIC   Provider:  Larey Ortiz, M D  Primary Care Physician:  Jay Hutching Coralee Pesa, NP   Referring Provider: Orma Render, NP    Chief Complaint  Patient presents with   Follow-up    Pt alone in room #11 and alone. Pt here today for f/u on sleep apnea. He was set up with a machine 12/2017 through [facility]. He has not used the machine in a year. When he had his check up they wanted him to re evaluate whether he should restart the therapy. He never received a replacement machine however he never used a so clean machine or kept it out in heat for long period of time.    02-09-2022: Pt alone in room #11 and alone. Pt here today for f/u on sleep apnea. He was set up with a machine 12/2017 through [facility]. He has not used the machine in a year. When he had his check up they wanted him to re evaluate whether he should restart the therapy. He never received a replacement machine however he never used a so clean machine or kept it out in heat for long period of time. He will a have to go to the web site and see if he can get a new machine.    HPI:  Jay Ortiz is a 36 y.o. male  former shift worker with [employer], seen here in a revisit ;he has graduated from NP School, he was referred to healthy weight and wellness clinic and there he was told to resume CPAP therapy. He has not used his CPAP for about a year. He recalls that he was dx with  borderline apnea- but had felt initially much more alert and refreshed  when using it. Something happened, he is not sure what, that made him get out of habit.  It was not a problem with the machine.   The last compliance and use the data are from October 2022, a year ago.  The machine has a pressures between 6 and 12 cmH2O with 3 cm expiratory relief, ramp starts at 4 cmH2O, ramp is set through 10 minutes.  Tube and humidifier temperature at 3 level 3.  In 2022 he used the machine for well over 4 hours each night with an average  95th percent pressure at 8 cm water.  He had used the machine in October last year 21 out of 30 days so 70%.  I will certainly invite him to go back on it my question is if he needs a different kind of headgear supplies sometimes age and are not comfortable anymore. It can be hard to restart CPAP supplies because insurance will not cover the cost if no compliance has been documented.  So today I will try to see if Jay Cambridge, NP ,  can return to CPAP for 30 days - based on that download I will then be able to give him prescriptions for new supplies.    I will look at his Epworth Sleepiness Scale 02-09-2022, as  a non-CPAP user and that was endorsed at only 4 out of 24 points. Fatigue severity score 14/ 63 points.      04-24-2019 with Ward Givens, NP.  Today 04/24/19:   Jay Ortiz is a 36 year old male with a history of obstructive sleep apnea on CPAP.  His download indicates that he uses machine 22 out of 30 days for compliance of 73.3%.  On average he uses machine greater than  4 hours 46.7%.  He uses his machine 4 hours and 49 minutes on average each night.  His residual AHI is 4.1 on 6 to 12 cm of water.  He states that he changed jobs and insurance so there was a period of time he did not get new supplies and was not using the machine.  He is back to using the machine but his mask and straps change.  He reports that he is trying to get used to using the machine again.  He states that he does notice the benefit when he was using the CPAP regularly    03-08-2018, Jay Ortiz underwent a HST  And this test was negative for apnea. We called him in for a repeated test, a diagnostic  baseline polysomnogram on 19 October 2017.  He had endorsed the Epworth Sleepiness Scale at 17 out of 24 points prior to this test he had rather mild apnea with an AHI of 5.1 but during REM sleep his AHI exacerbated to 22.9/h.  There was not much of the positional component to his apnea and they were many many spontaneous  arousals.   His baseline polysomnography was followed by an attended CPAP titration beginning at 5 cm water pressure and advancing to 10 when the AHI was reduced to 0.0.  The patient had frequent limb movements during the night but less frequent spontaneous arousals and almost none of the arousals was in relation to a respiratory event.  The total AHI became 0.3/h.  The study was followed up by a auto titration CPAP , he is using a Philips Respironics C-Flex machine provided by [facility].  The patient has used the machine for 84% of the 30-day.  He was not able to use the machine on 5 out of 30 days.  His average AHI is 3.7 which is a reduction in comparison to his baseline, he does not have major air leaks and the average device pressure is 8.2 cmH2O.  He is using a C-Flex machine with a ramp time of 10 minutes and a starting pressure of 4 cmH2O minimum pressure is 6 maximum 12 cmH2O.  3 cm EPR he feels that the machine is comfortable his daytime sleepiness is significantly reduced by upwards to 4 out of 24 points,     Originally seen in referral from Dr. Donnetta Hutching for an evaluation of excessive daytime sleepiness and non-restorative sleep.   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  Sleep habits are as follows: Jay Ortiz prefers to go to bed at 8 PM and he can usually sleep through the night when he is not on third shift.  He will fall asleep easily at that time and then rises in the morning with an alarm at 8 AM.  His sleep quality for nighttime sleep is good he does not believe that he snores and he has not been told that he has apnea. His bedroom at home is cool quiet and dark and during the time that he works third shift he is undisturbed.  He sleeps mainly on his side, but he does turn and change sleep position , he  uses 2 pillows. He will go home after a night shift which concludes about 7 AM and  reaches home at 8, usually he will try to sleep as early as possible, and in daytime is able to sleep for about 5:30 PM, waking again with alarm. He has worked third shift for a long time even while attending nursing school, but he does feel that now that he is enrolled in daytime classes and continues to work nights he has been more excessively daytime sleepy, and he feels sleep deprived. He is enrolled in the graduate school since autumn of 2017. Dr Maudie Mercury started modafinil.   Sleep medical history and family sleep history: father is a snorer, older sister is healthy, mother is healthy. Tonsillectomy at age 52 , no History of neck injury, ENT surgery, TBI.   Social history: single, no children, grew up in [place],  Non smoker, non or rarely drinking alcohol- and cut down on caffeine - 1 cup in AM and one at [employer]. He used to drink coffee all day and at night during shift. he is enrolled in daytime classes and continues to work nights he has been more excessively daytime sleepy, and he feels sleep deprived. He is enrolled in the graduate school since autumn of 2017.    Review of Systems:no snoring, no nocturia, no RLS, EDS is not narcolepsy related, no cataplexy, no sleep paralysis, no dream intrusion.  Out of a complete 14 system review, the patient complains of only the following symptoms, and all other reviewed systems are negative.   2021: Epworth score down to 4 points from 17 / 24 before CPAP  , Fatigue severity score endorsed at 17 / 63 ,  Hearing loss with tinnitus left ear- he has a hearing aid and is not using it today.  depression score n/a , shift worker   I will look at his Epworth Sleepiness Scale 02-09-2022, as  a non-CPAP user and that was endorsed at only 4 out of 24 points. Fatigue severity score 14/ 63 points.    Third shift worker on modafinil now. Side effects from modafinil - none.       02/02/2022    3:10 PM 11/24/2021    8:44 AM 01/27/2021    2:40 PM  Depression screen PHQ 2/9  Decreased Interest 0 1 0  Down, Depressed, Hopeless 0 0 0  PHQ - 2 Score 0 1 0  Altered sleeping 0 1 1  Tired, decreased energy 1 1 0  Change in appetite 1 2 1   Feeling bad or failure about yourself  1 0 0  Trouble concentrating 1 1 0  Moving slowly or fidgety/restless 0 0 0  Suicidal thoughts 0 0 0  PHQ-9 Score 4 6 2   Difficult doing work/chores Not difficult at all Not difficult at all Not difficult at all       Social History   Socioeconomic History   Marital status: Single    Spouse name: Not on file   Number of children: Not on file   Years of education: Not on file   Highest education level: Not on file  Occupational History   Not on file  Tobacco Use   Smoking status: Never   Smokeless tobacco: Never  Vaping Use   Vaping Use: Never used  Substance and Sexual Activity   Alcohol use: Yes    Comment: socially   Drug use: No   Sexual activity: Not on file  Other Topics Concern   Not on file  Social History Narrative   Not on file   Social Determinants of Health  Financial Resource Strain: Not on file  Food Insecurity: Not on file  Transportation Needs: Not on file  Physical Activity: Not on file  Stress: Not on file  Social Connections: Not on file  Intimate Partner Violence: Not on file    Family History  Problem Relation Age of Onset   Ulcers Mother    Hypertension Father    Hyperlipidemia Father    CVA Maternal Grandmother    Diabetes Mellitus II Maternal Grandfather    Colon cancer Paternal Grandmother    Heart attack Paternal Grandfather     Past Medical History:  Diagnosis Date   Anxiety    Daytime sleepiness    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 60 capsule 6   levocetirizine (XYZAL) 5 MG tablet Take 1 tablet (5 mg total) by mouth every evening. 90 tablet 3   lisinopril (ZESTRIL) 40 MG tablet Take 1 tablet (40 mg total) by mouth daily. 90 tablet 3   ondansetron (ZOFRAN) 8 MG tablet Take 1 tablet (8 mg total) by mouth every 8 (eight) hours as needed for nausea or vomiting. 30 tablet 2   Vitamin D, Ergocalciferol, (DRISDOL) 1.25 MG (50000 UNIT) CAPS capsule Take 1 capsule (50,000 Units total) by mouth every 7 (seven) days. 15 capsule 0   No current facility-administered medications for this visit.    Allergies as of 02/09/2022   (No Known Allergies)    Vitals: BP 131/85 (BP Location: Left Arm, Patient Position: Sitting, Cuff Size: Normal)   Pulse 65   Ht 5\' 10"  (1.778 m)   Wt 239 lb 8 oz (108.6 kg)   BMI 34.36 kg/m  Last Weight:  Wt Readings from Last 1 Encounters:  02/09/22 239 lb 8 oz (108.6 kg)   OIZ:TIWP mass index is 34.36 kg/m.     Last Height:   Ht Readings from Last 1 Encounters:  02/09/22 5\' 10"  (1.778 m)    Physical exam:  General: The patient is awake, alert and appears not in acute distress. The patient is well groomed. Head: Normocephalic, atraumatic. Neck is supple. Mallampati 3, tooth gap  neck circumference: 15.5 . Nasal airflow patent.  He wore braces in middle school and later a retainer.  Cardiovascular:  Regular rate and rhythm, without  murmurs or carotid bruit, and without distended neck veins. Respiratory: Lungs are clear to auscultation. Skin:  Without evidence of edema, or rash Trunk: BMI is 34, down from 35 . The patient's posture is erect.  Neurologic exam : The patient is awake and alert, oriented to place and time.   Mood and affect are appropriate.  Cranial nerves: Pupils are equal and briskly reactive to light. Visual fields by finger perimetry are intact.Hearing  to finger rub intact.  Facial sensation intact to fine touch. Facial motor strength is symmetric and tongue and uvula move midline.  Shoulder shrug was symmetrical.  Motor exam: Normal tone, muscle bulk and symmetric strength in all extremities. Sensory:  normal. Coordination: Rapid alternating movements in the fingers/hands was normal. Finger-to-nose maneuver  normal without evidence of ataxia, dysmetria or tremor.Gait and station: Patient walks without assistive device.  Deep tendon reflexes: in the  upper and lower extremities are symmetric and intact.   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   About a year ago he stopped using his CPAP and he is not quite sure why but it was not a difficulty to tolerate CPAP or a machine dysfunction.  Now that he enrolled in healthy weight and wellness program he has already reduced his body mass index under 35 kg/m2.    His goal is to lose about 14 more pounds.  His physician there advised him to use CPAP again to help restoring deep sleep stages that support metabolism for insulin and also blood pressure control.  In spite of being off CPAP however he is no longer excessively daytime sleepy as he used to be and endorsed 4 points on the Epworth Sleepiness Scale today.  He is also not significantly fatigued and never worse.  I will order a home sleep test to just verify that there is still treat where the apnea present and if so I will let the patient know to obtain a new Philips Respironics CPAP from the recall program if his old machine is visit and that recall badge. If there is no  longer apnea we do not have to really worry about that.  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    The patient was advised of the nature of the diagnosed disorder , the treatment options and the  risks for general health and wellness arising from not treating the condition.  I spent more than 35 minutes of face to face time with the patient.  Greater than 50% of time was spent in counseling and coordination of care. We have discussed the diagnosis and differential and I answered the patient's questions.    Plan:  Treatment plan and additional workup : HST ordered, if no apnea present, will not need to start CPAP>   RV if HST is positive in 2-4 months.   Jay Seat, MD [id number], [id number] PM  Certified in Neurology by ABPN Certified in [place] by [facility] [address], [place], [place] [address]

## 2022-02-16 DIAGNOSIS — H5203 Hypermetropia, bilateral: Secondary | ICD-10-CM | POA: Diagnosis not present

## 2022-02-21 ENCOUNTER — Other Ambulatory Visit (HOSPITAL_BASED_OUTPATIENT_CLINIC_OR_DEPARTMENT_OTHER): Payer: Self-pay | Admitting: Nurse Practitioner

## 2022-02-21 DIAGNOSIS — I10 Essential (primary) hypertension: Secondary | ICD-10-CM

## 2022-02-21 DIAGNOSIS — J302 Other seasonal allergic rhinitis: Secondary | ICD-10-CM

## 2022-02-22 ENCOUNTER — Other Ambulatory Visit (HOSPITAL_BASED_OUTPATIENT_CLINIC_OR_DEPARTMENT_OTHER): Payer: Self-pay

## 2022-02-22 MED ORDER — LEVOCETIRIZINE DIHYDROCHLORIDE 5 MG PO TABS
5.0000 mg | ORAL_TABLET | Freq: Every evening | ORAL | 3 refills | Status: DC
  Filled 2022-02-22: qty 90, 90d supply, fill #0
  Filled 2022-06-01 (×2): qty 90, 90d supply, fill #1
  Filled 2022-08-26 – 2022-09-06 (×2): qty 90, 90d supply, fill #2
  Filled 2022-12-07: qty 90, 90d supply, fill #3

## 2022-02-22 MED ORDER — LISINOPRIL 40 MG PO TABS
40.0000 mg | ORAL_TABLET | Freq: Every day | ORAL | 3 refills | Status: DC
  Filled 2022-02-22: qty 90, 90d supply, fill #0
  Filled 2022-06-01 (×2): qty 90, 90d supply, fill #1

## 2022-02-23 ENCOUNTER — Encounter (INDEPENDENT_AMBULATORY_CARE_PROVIDER_SITE_OTHER): Payer: Self-pay | Admitting: Family Medicine

## 2022-02-23 ENCOUNTER — Ambulatory Visit (INDEPENDENT_AMBULATORY_CARE_PROVIDER_SITE_OTHER): Payer: 59 | Admitting: Family Medicine

## 2022-02-23 VITALS — BP 110/68 | HR 73 | Temp 98.3°F | Ht 70.0 in | Wt 230.0 lb

## 2022-02-23 DIAGNOSIS — E669 Obesity, unspecified: Secondary | ICD-10-CM

## 2022-02-23 DIAGNOSIS — Z6833 Body mass index (BMI) 33.0-33.9, adult: Secondary | ICD-10-CM

## 2022-02-23 DIAGNOSIS — E88819 Insulin resistance, unspecified: Secondary | ICD-10-CM | POA: Diagnosis not present

## 2022-02-23 DIAGNOSIS — E559 Vitamin D deficiency, unspecified: Secondary | ICD-10-CM

## 2022-02-23 DIAGNOSIS — G4733 Obstructive sleep apnea (adult) (pediatric): Secondary | ICD-10-CM | POA: Diagnosis not present

## 2022-03-04 NOTE — Progress Notes (Signed)
Chief Complaint:   OBESITY Jay Ortiz is here to discuss his progress with his obesity treatment plan along with follow-up of his obesity related diagnoses. Jay Ortiz is on the Category 3 Plan 110 protein and states he is following his eating plan approximately 100% of the time. Jay Ortiz states he is 30-45 minutes 5 times per week.  Today's visit was #: 5 Starting weight: 256 lbs Starting date: 11/24/2021 Today's weight: 230 lbs Today's date: 02/23/2022 Total lbs lost to date: 26 lbs Total lbs lost since last in-office visit: 11 lbs  Interim History: He has increased exercise and added weight training 3 times per week.  He is getting an exercise bike.  He denies hunger, has reduced alcohol intake.  He has been trying to get in more water.   Subjective:   1. OSA (obstructive sleep apnea) He is awaiting a home sleep study with Dr Vickey Huger.  He has not needed a CPAP for a few years.  He has lost 26 lbs in 3 months.   2. Insulin resistance Last fasting insulin elevated at 21. Actively reducing sugar and starches.    3. Vitamin D deficiency Energy level improving, he is on prescription Vitamin D 50,000 IU weekly.   Assessment/Plan:   1. OSA (obstructive sleep apnea) Follow up results of home sleep study.  2. Insulin resistance Recheck fasting insulin in December. Continue dietary plan with regular exercise.   3. Vitamin D deficiency Recheck Vitamin D level in December.  Continue Vitamin D 50,000 IU weekly.  4. Obesity, current BMI 33.1 Discussed exercise options with changing season.   Jay Ortiz is currently in the action stage of change. As such, his goal is to continue with weight loss efforts. He has agreed to the Category 3 Plan+110 protein daily.   Exercise goals:  As is.   Behavioral modification strategies: increasing lean protein intake, increasing vegetables, increasing water intake, decreasing alcohol intake, decreasing eating out, meal planning and cooking strategies, and  planning for success.  Jay Ortiz has agreed to follow-up with our clinic in 3-4 weeks. He was informed of the importance of frequent follow-up visits to maximize his success with intensive lifestyle modifications for his multiple health conditions.   Objective:   Blood pressure 110/68, pulse 73, temperature 98.3 F (36.8 C), height 5\' 10"  (1.778 m), weight 230 lb (104.3 kg), SpO2 97 %. Body mass index is 33 kg/m.  General: Cooperative, alert, well developed, in no acute distress. HEENT: Conjunctivae and lids unremarkable. Cardiovascular: Regular rhythm.  Lungs: Normal work of breathing. Neurologic: No focal deficits.   Lab Results  Component Value Date   CREATININE 0.98 11/24/2021   BUN 14 11/24/2021   NA 137 11/24/2021   K 4.5 11/24/2021   CL 99 11/24/2021   CO2 23 11/24/2021   Lab Results  Component Value Date   ALT 22 11/24/2021   AST 21 11/24/2021   ALKPHOS 42 (L) 11/24/2021   BILITOT 0.6 11/24/2021   Lab Results  Component Value Date   HGBA1C 5.6 11/24/2021   No results found for: "INSULIN" Lab Results  Component Value Date   TSH 1.600 11/24/2021   Lab Results  Component Value Date   CHOL 205 (H) 11/24/2021   HDL 35 (L) 11/24/2021   LDLCALC 141 (H) 11/24/2021   TRIG 159 (H) 11/24/2021   CHOLHDL 5.9 (H) 11/24/2021   Lab Results  Component Value Date   VD25OH 18.5 (L) 11/24/2021   Lab Results  Component Value Date  WBC 8.9 11/24/2021   HGB 16.1 11/24/2021   HCT 47.7 11/24/2021   MCV 88 11/24/2021   PLT 297 11/24/2021   No results found for: "IRON", "TIBC", "FERRITIN"  Attestation Statements:   Reviewed by clinician on day of visit: allergies, medications, problem list, medical history, surgical history, family history, social history, and previous encounter notes.  I, Davy Pique, am acting as Location manager for Loyal Gambler, DO.  I have reviewed the above documentation for accuracy and completeness, and I agree with the above. Dell Ponto,  DO

## 2022-03-16 ENCOUNTER — Telehealth: Payer: Self-pay | Admitting: Neurology

## 2022-03-16 NOTE — Telephone Encounter (Signed)
HST- [facility] no auth req ref # Harriett Sine 03/03/22.  Patient is scheduled at [facility] for 04/06/22 at 2 pm.  Mailed packet to the patient.

## 2022-03-23 ENCOUNTER — Encounter (INDEPENDENT_AMBULATORY_CARE_PROVIDER_SITE_OTHER): Payer: Self-pay | Admitting: Family Medicine

## 2022-03-23 ENCOUNTER — Other Ambulatory Visit (HOSPITAL_BASED_OUTPATIENT_CLINIC_OR_DEPARTMENT_OTHER): Payer: Self-pay

## 2022-03-23 ENCOUNTER — Ambulatory Visit (INDEPENDENT_AMBULATORY_CARE_PROVIDER_SITE_OTHER): Payer: 59 | Admitting: Family Medicine

## 2022-03-23 VITALS — BP 110/66 | HR 47 | Temp 98.2°F | Ht 70.0 in | Wt 224.0 lb

## 2022-03-23 DIAGNOSIS — E88819 Insulin resistance, unspecified: Secondary | ICD-10-CM | POA: Diagnosis not present

## 2022-03-23 DIAGNOSIS — R001 Bradycardia, unspecified: Secondary | ICD-10-CM | POA: Diagnosis not present

## 2022-03-23 DIAGNOSIS — Z6832 Body mass index (BMI) 32.0-32.9, adult: Secondary | ICD-10-CM

## 2022-03-23 DIAGNOSIS — E7849 Other hyperlipidemia: Secondary | ICD-10-CM

## 2022-03-23 DIAGNOSIS — E669 Obesity, unspecified: Secondary | ICD-10-CM

## 2022-03-23 DIAGNOSIS — E559 Vitamin D deficiency, unspecified: Secondary | ICD-10-CM

## 2022-03-23 DIAGNOSIS — R0683 Snoring: Secondary | ICD-10-CM | POA: Insufficient documentation

## 2022-03-23 HISTORY — DX: Snoring: R06.83

## 2022-03-23 MED ORDER — VITAMIN D (ERGOCALCIFEROL) 1.25 MG (50000 UNIT) PO CAPS
50000.0000 [IU] | ORAL_CAPSULE | ORAL | 0 refills | Status: DC
  Filled 2022-03-23 – 2022-04-19 (×2): qty 5, 35d supply, fill #0

## 2022-03-29 ENCOUNTER — Other Ambulatory Visit (INDEPENDENT_AMBULATORY_CARE_PROVIDER_SITE_OTHER): Payer: 59

## 2022-03-29 DIAGNOSIS — E559 Vitamin D deficiency, unspecified: Secondary | ICD-10-CM

## 2022-03-29 DIAGNOSIS — E88819 Insulin resistance, unspecified: Secondary | ICD-10-CM

## 2022-03-29 DIAGNOSIS — E7849 Other hyperlipidemia: Secondary | ICD-10-CM | POA: Diagnosis not present

## 2022-04-02 LAB — LIPID PANEL
Chol/HDL Ratio: 4.4 ratio (ref 0.0–5.0)
Cholesterol, Total: 144 mg/dL (ref 100–199)
HDL: 33 mg/dL — ABNORMAL LOW (ref >39)
LDL Chol Calc (NIH): 91 mg/dL (ref 0–99)
Triglycerides: 105 mg/dL (ref 0–149)
VLDL Cholesterol Cal: 20 mg/dL (ref 5–40)

## 2022-04-02 LAB — VITAMIN D, 25-HYDROXY, TOTAL: Vitamin D, 25-Hydroxy, Serum: 51 ng/mL

## 2022-04-02 LAB — INSULIN, RANDOM: INSULIN: 12.9 u[IU]/mL (ref 2.6–24.9)

## 2022-04-02 LAB — SPECIMEN STATUS REPORT

## 2022-04-06 ENCOUNTER — Ambulatory Visit: Payer: 59 | Admitting: Neurology

## 2022-04-06 DIAGNOSIS — G4733 Obstructive sleep apnea (adult) (pediatric): Secondary | ICD-10-CM | POA: Diagnosis not present

## 2022-04-06 DIAGNOSIS — R0683 Snoring: Secondary | ICD-10-CM

## 2022-04-06 DIAGNOSIS — I10 Essential (primary) hypertension: Secondary | ICD-10-CM

## 2022-04-06 DIAGNOSIS — E669 Obesity, unspecified: Secondary | ICD-10-CM

## 2022-04-06 DIAGNOSIS — Z8669 Personal history of other diseases of the nervous system and sense organs: Secondary | ICD-10-CM

## 2022-04-06 DIAGNOSIS — E66811 Obesity, class 1: Secondary | ICD-10-CM

## 2022-04-06 NOTE — Progress Notes (Signed)
Chief Complaint:   OBESITY Jay Ortiz is here to discuss his progress with his obesity treatment plan along with follow-up of his obesity related diagnoses. Jay Ortiz is on the Category 3 Plan+110 protein and states he is following his eating plan approximately 90% of the time. [name] states he is walking treadmill and using free weights 30-45 minutes 5 times per week.  Today's visit was #: 6 Starting weight: 256 lbs Starting date: 11/24/2021 Today's weight: 224 lbs Today's date: 03/23/2022 Total lbs lost to date: 32 lbs Total lbs lost since last in-office visit: 6 lbs  Interim History: Added a treadmill with walk and run intervals and weight training. Feels good about results.  Doing well with meal plan.  Makes better choices with dining out.  Occasional ETOH intake.  Denies cravings.   Subjective:   1. Insulin resistance Insulin level 21, on 07/25.  Has lost 32 lbs.  Doing well with a reduced carbohydrate diet.  2. Bradycardia Asymptomatic.  Apple watch tracking resting heart rate 46, low 60's.  EKG done 11/30/2021 did not reveal any heart block.   3. Snoring Previously mild OSA, mild and used CPAP off since October.  Awaiting home sleep study.    4. Vitamin D deficiency He is currently taking prescription vitamin D 50,000 IU each week. He denies nausea, vomiting or muscle weakness. Vitamin D level 18.5 on 11/24/2021.    5. Other hyperlipidemia Last fasting lipid panel showed:  Total Cholesterol 205, Triglycerides 159, HDL 35, LDL 141, on 11/24/21.  Not on lipid lowering therapy.   Assessment/Plan:   1. Insulin resistance Recheck fasting insulin level, future ordered.   - Insulin, random; Future  2. Bradycardia Consider cardiac workup if symptoms develop.   3. Snoring Follow up results of sleep study with Dr Vickey Huger.    4. Vitamin D deficiency Check labs today.   - Vitamin D, 25-Hydroxy, Total; Future  Refill - Vitamin D, Ergocalciferol, (DRISDOL) 1.25 MG (50000  UNIT) CAPS capsule; Take 1 capsule (50,000 Units total) by mouth every 7 (seven) days.  Dispense: 5 capsule; Refill: 0  5. Other hyperlipidemia Check labs today. Look for improvements since weight loss, diet and exercise changes.   - Lipid panel; Future  6. Obesity,current BMI 32.2 Reviewed net weight loss of 32 lbs in 4 months =12.5% TBW loss.  Visceral fat rating down to 11 from 16.   Jay Ortiz is currently in the action stage of change. As such, his goal is to continue with weight loss efforts. He has agreed to the Category 3 Plan+110 daily.   Exercise goals:  As is.   Behavioral modification strategies: increasing lean protein intake, increasing vegetables, increasing water intake, decreasing alcohol intake, decreasing eating out, no skipping meals, meal planning and cooking strategies, keeping healthy foods in the home, and planning for success.  [name] has agreed to follow-up with our clinic in 4 weeks. He was informed of the importance of frequent follow-up visits to maximize his success with intensive lifestyle modifications for his multiple health conditions.   Objective:   Blood pressure 110/66, pulse (!) 47, temperature 98.2 F (36.8 C), height 5\' 10"  (1.778 m), weight 224 lb (101.6 kg), SpO2 98 %. Body mass index is 32.14 kg/m.  General: Cooperative, alert, well developed, in no acute distress. HEENT: Conjunctivae and lids unremarkable. Cardiovascular: Regular rhythm.  Lungs: Normal work of breathing. Neurologic: No focal deficits.   Lab Results  Component Value Date   CREATININE 0.98 11/24/2021   BUN  14 11/24/2021   NA 137 11/24/2021   K 4.5 11/24/2021   CL 99 11/24/2021   CO2 23 11/24/2021   Lab Results  Component Value Date   ALT 22 11/24/2021   AST 21 11/24/2021   ALKPHOS 42 (L) 11/24/2021   BILITOT 0.6 11/24/2021   Lab Results  Component Value Date   HGBA1C 5.6 11/24/2021   Lab Results  Component Value Date   INSULIN 12.9 03/29/2022   Lab Results   Component Value Date   TSH 1.600 11/24/2021   Lab Results  Component Value Date   CHOL 144 03/29/2022   HDL 33 (L) 03/29/2022   LDLCALC 91 03/29/2022   TRIG 105 03/29/2022   CHOLHDL 4.4 03/29/2022   Lab Results  Component Value Date   VD25OH 18.5 (L) 11/24/2021   Lab Results  Component Value Date   WBC 8.9 11/24/2021   HGB 16.1 11/24/2021   HCT 47.7 11/24/2021   MCV 88 11/24/2021   PLT 297 11/24/2021   No results found for: "IRON", "TIBC", "FERRITIN"  Attestation Statements:   Reviewed by clinician on day of visit: allergies, medications, problem list, medical history, surgical history, family history, social history, and previous encounter notes.  I, Malcolm Metro, am acting as Energy manager for Seymour Bars, DO.  I have reviewed the above documentation for accuracy and completeness, and I agree with the above. [name], DO

## 2022-04-07 ENCOUNTER — Other Ambulatory Visit (HOSPITAL_BASED_OUTPATIENT_CLINIC_OR_DEPARTMENT_OTHER): Payer: Self-pay

## 2022-04-08 NOTE — Progress Notes (Signed)
See procedure note.

## 2022-04-09 ENCOUNTER — Telehealth: Payer: Self-pay | Admitting: Neurology

## 2022-04-09 NOTE — Procedures (Signed)
   [facility]  HOME SLEEP TEST (Watch PAT) REPORT  STUDY DATE: 04/06/2022  DOB: 18-Nov-1985  MRN: [id number]  ORDERING CLINICIAN: Huston Foley, MD, PhD - study interpreted on behalf of Dr. Vickey Huger   REFERRING CLINICIAN: Early, Sung Amabile, NP (PCP), Dr. Vickey Huger (sleep)  CLINICAL INFORMATION/HISTORY: 36 year old male with an underlying medical history of hypertension, hearing loss, anxiety and obesity, who presents for reevaluation of his obstructive sleep apnea.  He is currently not on a PAP machine and was encouraged to get reassessed for sleep disordered breathing, has a prior diagnosis of mild sleep apnea.  Epworth sleepiness score: 4/24.  BMI: 34.4 kg/m  FINDINGS:   Sleep Summary:   Total Recording Time (hours, min): 8 hours, 45 min  Total Sleep Time (hours, min):  8 hours, 17 min  Percent REM (%):    19.6%   Respiratory Indices:   Calculated pAHI (per hour):  2.6/hour         REM pAHI:    5.3/hour       NREM pAHI: 2.0/hour  Central pAHI: 0/hour  Oxygen Saturation Statistics:    Oxygen Saturation (%) Mean: 96%   Minimum oxygen saturation (%):                 71%   O2 Saturation Range (%): 71-100%    O2 Saturation (minutes) <=88%: 0.1 min  Pulse Rate Statistics:   Pulse Mean (bpm):    44/min    Pulse Range (36-82/min)   IMPRESSION: Primary snoring   RECOMMENDATION:  This home sleep test does not demonstrate any significant obstructive or central sleep disordered breathing with a total AHI of less than 5/hour. The  total AHI was 2.6/hour, O2 nadir of 71%, he had a total of about 4 desaturations into the 80s and 70s, these were noted in the last portion of the night.  Evaluation with an attended sleep study is indicated if patient has a high suspicion for underlying sleep disordered breathing.  Based on this test results, treatment with a positive airway pressure device such as AutoPap or CPAP is not indicated based.  Intermittent snoring  was noted only. Snoring may improve with avoidance of the supine sleep position and weight loss (where clinically appropriate).  For disturbing snoring, an oral appliance through dentistry or orthodontics can be considered.  Other causes of the patient's symptoms, including circadian rhythm disturbances, an underlying mood disorder, medication effect and/or an underlying medical problem cannot be ruled out based on this test. Clinical correlation is recommended.  The patient should be cautioned not to drive, work at heights, or operate dangerous or heavy equipment when tired or sleepy. Review and reiteration of good sleep hygiene measures should be pursued with any patient. The patient will be advised to follow up with his referring provider, who will be notified of the test results.   I certify that I have reviewed the raw data recording prior to the issuance of this report in accordance with the standards of the American Academy of Sleep Medicine (AASM).  INTERPRETING PHYSICIAN:   Huston Foley, MD, PhD Medical Director, [facility]) Diplomat, ABPN (Neurology and Sleep)   [facility] [address] [place], [place] [address] [phone]

## 2022-04-09 NOTE — Telephone Encounter (Signed)
Patient saw Dr. Vickey Huger for reevaluation of his sleep apnea, he has not been using her PAP machine recently.  He had a home sleep test on 04/06/2022 which did not indicate any significant sleep disordered breathing.  Please verify that he did not use his AutoPap machine at the time of testing.   At the end of the study he had a few desaturations including the higher 80s and a couple into the 70s.  Based on the test results he does not need to be on a CPAP machine as his AHI was less than 5/h, his was 2.6/h.  A laboratory study can be considered if there is a high suspicion for sleep apnea.  He can discuss this with Dr. Vickey Huger upon her return, in the meantime, I recommend he follow-up with primary care and discuss other reasons for oxygen desaturations during sleep, maybe evaluation with a pulmonologist for underlying lung disease.  I also recommend ongoing weight loss (he is followed by [facility]).

## 2022-04-12 ENCOUNTER — Encounter: Payer: Self-pay | Admitting: Nurse Practitioner

## 2022-04-12 NOTE — Telephone Encounter (Signed)
LVM for pt to call about results. °

## 2022-04-12 NOTE — Telephone Encounter (Signed)
Took call from phone staff and spoke with pt. He confirmed he has not used CPAP in over a yr and did not use during HST. Relayed results per Dr. Frances Furbish note. He will f/u with PCP about seeing pulmonologist for further evaluation of his drop in oxygen levels.

## 2022-04-15 ENCOUNTER — Other Ambulatory Visit: Payer: Self-pay | Admitting: Nurse Practitioner

## 2022-04-15 DIAGNOSIS — G4734 Idiopathic sleep related nonobstructive alveolar hypoventilation: Secondary | ICD-10-CM

## 2022-04-15 DIAGNOSIS — E669 Obesity, unspecified: Secondary | ICD-10-CM

## 2022-04-15 DIAGNOSIS — R001 Bradycardia, unspecified: Secondary | ICD-10-CM

## 2022-04-15 DIAGNOSIS — R0683 Snoring: Secondary | ICD-10-CM

## 2022-04-19 ENCOUNTER — Other Ambulatory Visit (HOSPITAL_BASED_OUTPATIENT_CLINIC_OR_DEPARTMENT_OTHER): Payer: Self-pay

## 2022-04-20 ENCOUNTER — Ambulatory Visit (INDEPENDENT_AMBULATORY_CARE_PROVIDER_SITE_OTHER): Payer: 59 | Admitting: Family Medicine

## 2022-04-20 ENCOUNTER — Other Ambulatory Visit: Payer: Self-pay

## 2022-04-20 ENCOUNTER — Encounter (INDEPENDENT_AMBULATORY_CARE_PROVIDER_SITE_OTHER): Payer: Self-pay | Admitting: Family Medicine

## 2022-04-20 ENCOUNTER — Institutional Professional Consult (permissible substitution): Payer: 59 | Admitting: Nurse Practitioner

## 2022-04-20 VITALS — BP 105/66 | HR 54 | Temp 98.4°F | Ht 70.0 in | Wt 216.0 lb

## 2022-04-20 DIAGNOSIS — E669 Obesity, unspecified: Secondary | ICD-10-CM

## 2022-04-20 DIAGNOSIS — E7849 Other hyperlipidemia: Secondary | ICD-10-CM

## 2022-04-20 DIAGNOSIS — E559 Vitamin D deficiency, unspecified: Secondary | ICD-10-CM

## 2022-04-20 DIAGNOSIS — Z6831 Body mass index (BMI) 31.0-31.9, adult: Secondary | ICD-10-CM

## 2022-04-20 DIAGNOSIS — E88819 Insulin resistance, unspecified: Secondary | ICD-10-CM | POA: Diagnosis not present

## 2022-04-27 NOTE — Progress Notes (Signed)
Chief Complaint:   OBESITY Jay Ortiz is here to discuss his progress with his obesity treatment plan along with follow-up of his obesity related diagnoses. Jay Ortiz is on the Category 3 Plan+110 protein and states he is following his eating plan approximately 90% of the time. Jay Ortiz states he is treadmill at home 30-45 minutes 5 times per week.  Today's visit was #: 7 Starting weight: 256 lbs Starting date: 11/24/2021 Today's weight: 216 lbs Today's date: 04/20/2022 Total lbs lost to date: 40 lbs Total lbs lost since last in-office visit: 8 lbs  Interim History: Added in weight training 3 times per week.  Traveling 2-3 days to see parents.  Has a birthday upcoming, 04/22/2020, with a dinner celebration for Christmas planned.  Keeping walking 30-45 minutes, 5 days per week, averaging 10,000 steps per day.  Subjective:   1. Vitamin D deficiency Discussed labs with patient today.  Patient is on prescription vitamin D weekly 50,000 IU.  Vitamin D level improved to 51 from 18.5.  2. Other hyperlipidemia Discussed labs with patient today. Improving with healthy dietary changes.  03/29/2022, total cholesterol 144 down from 205.  11/24/2021 triglycerides 159 down to 105.  LDL 91 down from 141, 11/24/2021.  3. Insulin resistance Discussed labs with patient today. Improving. Reduced to 12.9 from 21.  Has greatly reduced carb and sugar intake and increase walking time.  Assessment/Plan:   1. Vitamin D deficiency Use up remaining prescription vitamin D q. 14 days and change to OTC vitamin D 4000 IU daily.  2. Other hyperlipidemia Continue to increase exercise to boost HDL.  3. Insulin resistance Continue healthy lifestyle changes.  4. Obesity,current BMI 31.0 Add in protein shake post workout to help with muscle building.  Reviewed progress:  down 40 lb in 5 months, = 14.3% TBW loss.  Jay Ortiz is currently in the action stage of change. As such, his goal is to continue with weight loss  efforts. He has agreed to the Category 3 Plan.   Exercise goals:  As is.  Behavioral modification strategies: increasing lean protein intake, increasing vegetables, increasing water intake, decreasing eating out, no skipping meals, meal planning and cooking strategies, holiday eating strategies , celebration eating strategies, and planning for success.  Jay Ortiz has agreed to follow-up with our clinic in 4-5 weeks. He was informed of the importance of frequent follow-up visits to maximize his success with intensive lifestyle modifications for his multiple health conditions.   Objective:   Blood pressure 105/66, pulse (!) 54, temperature 98.4 F (36.9 C), height 5\' 10"  (1.778 m), weight 216 lb (98 kg), SpO2 98 %. Body mass index is 30.99 kg/m.  General: Cooperative, alert, well developed, in no acute distress. HEENT: Conjunctivae and lids unremarkable. Cardiovascular: Regular rhythm.  Lungs: Normal work of breathing. Neurologic: No focal deficits.   Lab Results  Component Value Date   CREATININE 0.98 11/24/2021   BUN 14 11/24/2021   NA 137 11/24/2021   K 4.5 11/24/2021   CL 99 11/24/2021   CO2 23 11/24/2021   Lab Results  Component Value Date   ALT 22 11/24/2021   AST 21 11/24/2021   ALKPHOS 42 (L) 11/24/2021   BILITOT 0.6 11/24/2021   Lab Results  Component Value Date   HGBA1C 5.6 11/24/2021   Lab Results  Component Value Date   INSULIN 12.9 03/29/2022   Lab Results  Component Value Date   TSH 1.600 11/24/2021   Lab Results  Component Value Date   CHOL  144 03/29/2022   HDL 33 (L) 03/29/2022   LDLCALC 91 03/29/2022   TRIG 105 03/29/2022   CHOLHDL 4.4 03/29/2022   Lab Results  Component Value Date   VD25OH 18.5 (L) 11/24/2021   Lab Results  Component Value Date   WBC 8.9 11/24/2021   HGB 16.1 11/24/2021   HCT 47.7 11/24/2021   MCV 88 11/24/2021   PLT 297 11/24/2021   No results found for: "IRON", "TIBC", "FERRITIN"  Attestation Statements:    Reviewed by clinician on day of visit: allergies, medications, problem list, medical history, surgical history, family history, social history, and previous encounter notes.  I, Malcolm Metro, am acting as Energy manager for Seymour Bars, DO.  I have reviewed the above documentation for accuracy and completeness, and I agree with the above. [name], DO

## 2022-05-23 NOTE — Progress Notes (Unsigned)
05/25/22- 36 yoM never smoker for sleep evaluation courtesy of Jacolyn Reedy, NP with concern of Nocturnal Hypoxemia, hx OSA.  Works as a Designer, jewellery for [employer]. Medical problem list includes HTN, Allergic Rhinitis, Hearing Loss, Obesity, Anxiety, Hyperlipidemia, Shift-Work Sleep Disorder,  HST 08/17/17- AHI 1.6/ hr, desaturation to 89%, body weight 228 lbs NPSG ([facility]/ [facility]) 10/19/17-AHI 5.1/ hr, desat to 87%, body weight  HST 04/06/22 ([facility]) AHI 2.6/ hr, desat to 71%,  CPAP titration done by [facility] 11/23/17- 10 cwp with nasal mask Epworth score- 3 Body weight today- 214 lbs Covid vax-11 Phizer Flu vax-had Ongoing concern apparently was daytime somnolence- mood disorder questioned. MSLT mentioned but not done. Pulmonology eval for Nocturnal Hypoxemia was suggested by Dr Rexene Alberts at [place] encounter 04/09/22. We discussed his prior sleep studies and he emphasizes that the current concern is simply question of nocturnal hypoxemia.  He has no history of lung disease and denies exertional dyspnea, cough or wheeze. Occasional hydroxyzine to help sleep.  3 cups of daily coffee. Has lost about 40 pounds with diet and exercise.. Lab- 11/24/21- Hgb 16.1  Prior to Admission medications   Medication Sig Start Date End Date Taking? Authorizing Provider  hydrOXYzine (VISTARIL) 25 MG capsule Take 1 capsule (25 mg total) by mouth 2 (two) times daily as needed. 01/27/21  Yes Early, Coralee Pesa, NP  levocetirizine (XYZAL) 5 MG tablet Take 1 tablet (5 mg total) by mouth every evening. 02/22/22  Yes Early, Coralee Pesa, NP  lisinopril (ZESTRIL) 40 MG tablet Take 1 tablet (40 mg total) by mouth daily. 02/22/22  Yes Early, Coralee Pesa, NP  ondansetron (ZOFRAN) 8 MG tablet Take 1 tablet (8 mg total) by mouth every 8 (eight) hours as needed for nausea or vomiting. 01/15/22  Yes Early, Coralee Pesa, NP  Vitamin D, Ergocalciferol, (DRISDOL) 1.25 MG (50000 UNIT) CAPS capsule Take 1 capsule (50,000 Units total) by mouth every 7  (seven) days. 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/12/2017   Hypertension    Shifting sleep-work schedule, affecting sleep 07/12/2017   Sleep apnea    Past Surgical History:  Procedure Laterality Date   TONSILLECTOMY     Family History  Problem Relation Age of Onset   Ulcers Mother    Hypertension Father    Hyperlipidemia Father    CVA Maternal Grandmother    Diabetes Mellitus II Maternal Grandfather    Colon cancer Paternal Grandmother    Heart attack Paternal Grandfather    Social History   Socioeconomic History   Marital status: Single    Spouse name: Not on file   Number of children: Not on file   Years of education: Not on file   Highest education level: Not on file  Occupational History   Not on file  Tobacco Use   Smoking status: Never   Smokeless tobacco: Never  Vaping Use   Vaping Use: Never used  Substance and Sexual Activity   Alcohol use: Yes    Comment: socially   Drug use: No   Sexual activity: Not on file  Other Topics Concern   Not on file  Social History Narrative   Not on file   Social Determinants of Health   Financial Resource Strain: Not on file  Food Insecurity: Not on file  Transportation  Needs: Not on file  Physical Activity: Not on file  Stress: Not on file  Social Connections: Not on file  Intimate Partner Violence: Not on file   ROS-see HPI   + = positive Constitutional:    weight loss, night sweats, fevers, chills, fatigue, lassitude. HEENT:    headaches, difficulty swallowing, tooth/dental problems, sore throat,       sneezing, itching, ear ache, nasal congestion, post nasal drip, snoring CV:    chest pain, orthopnea, PND, swelling in lower extremities, anasarca,                                   dizziness, palpitations Resp:   shortness of breath with exertion or at rest.                 productive cough,   non-productive cough, coughing up of blood.              change in color of mucus.  wheezing.   Skin:    rash or lesions. GI:  No-   heartburn, indigestion, abdominal pain, nausea, vomiting, diarrhea,                 change in bowel habits, loss of appetite GU: dysuria, change in color of urine, no urgency or frequency.   flank pain. MS:   joint pain, stiffness, decreased range of motion, back pain. Neuro-     nothing unusual Psych:  change in mood or affect.  depression or anxiety.   memory loss.  OBJ- Physical Exam General- Alert, Oriented, Affect-appropriate, Distress- none acute Skin- rash-none, lesions- none, excoriation- none Lymphadenopathy- none Head- atraumatic            Eyes- Gross vision intact, PERRLA, conjunctivae and secretions clear            Ears- Hearing, canals-normal            Nose- Clear, no-Septal dev, mucus, polyps, erosion, perforation             Throat- Mallampati II-III , mucosa clear , drainage- none, tonsils absent, Neck- flexible , trachea midline, no stridor , thyroid nl, carotid no bruit Chest - symmetrical excursion , unlabored           Heart/CV- RRR , no murmur , no gallop  , no rub, nl s1 s2                           - JVD- none , edema- none, stasis changes- none, varices- none           Lung- clear to P&A, wheeze- none, cough- none , dullness-none, rub- none           Chest wall-  Abd-  Br/ Gen/ Rectal- Not done, not indicated Extrem- cyanosis- none, clubbing, none, atrophy- none, strength- nl Neuro- grossly intact to observation

## 2022-05-25 ENCOUNTER — Ambulatory Visit (INDEPENDENT_AMBULATORY_CARE_PROVIDER_SITE_OTHER): Payer: Commercial Managed Care - PPO | Admitting: Internal Medicine

## 2022-05-25 ENCOUNTER — Encounter: Payer: Self-pay | Admitting: Internal Medicine

## 2022-05-25 VITALS — BP 112/84 | HR 57 | Ht 70.0 in | Wt 214.4 lb

## 2022-05-25 DIAGNOSIS — G4734 Idiopathic sleep related nonobstructive alveolar hypoventilation: Secondary | ICD-10-CM

## 2022-05-25 DIAGNOSIS — G4733 Obstructive sleep apnea (adult) (pediatric): Secondary | ICD-10-CM

## 2022-05-25 DIAGNOSIS — E669 Obesity, unspecified: Secondary | ICD-10-CM

## 2022-05-25 NOTE — Patient Instructions (Addendum)
Order- schedule overnight oximetry    dx Nocturnal Hypoxemia  Please call us about 2 weeks after test for results

## 2022-05-26 ENCOUNTER — Encounter: Payer: Self-pay | Admitting: Internal Medicine

## 2022-05-26 DIAGNOSIS — G4734 Idiopathic sleep related nonobstructive alveolar hypoventilation: Secondary | ICD-10-CM | POA: Insufficient documentation

## 2022-05-26 NOTE — Assessment & Plan Note (Signed)
Significant OSA has not been documented with repeated sleep studies.

## 2022-05-26 NOTE — Assessment & Plan Note (Signed)
Significant weight loss with diet/ exercise is likely to help several issue, including nocturnal hypoxemia.

## 2022-05-26 NOTE — Assessment & Plan Note (Signed)
Nocturnal hypoxemia may have reflected obesity hypoventilation, in which case his weight loss is likely to be helpful.  No evidence of underlying cardiopulmonary disease at this time. Plan-overnight oximetry

## 2022-06-01 ENCOUNTER — Encounter (INDEPENDENT_AMBULATORY_CARE_PROVIDER_SITE_OTHER): Payer: Self-pay | Admitting: Family Medicine

## 2022-06-01 ENCOUNTER — Other Ambulatory Visit (HOSPITAL_BASED_OUTPATIENT_CLINIC_OR_DEPARTMENT_OTHER): Payer: Self-pay

## 2022-06-01 ENCOUNTER — Ambulatory Visit (INDEPENDENT_AMBULATORY_CARE_PROVIDER_SITE_OTHER): Payer: Commercial Managed Care - PPO | Admitting: Family Medicine

## 2022-06-01 VITALS — BP 111/87 | HR 48 | Temp 98.4°F | Ht 70.0 in | Wt 206.0 lb

## 2022-06-01 DIAGNOSIS — G47411 Narcolepsy with cataplexy: Secondary | ICD-10-CM | POA: Diagnosis not present

## 2022-06-01 DIAGNOSIS — E669 Obesity, unspecified: Secondary | ICD-10-CM

## 2022-06-01 DIAGNOSIS — E88819 Insulin resistance, unspecified: Secondary | ICD-10-CM

## 2022-06-01 DIAGNOSIS — Z6829 Body mass index (BMI) 29.0-29.9, adult: Secondary | ICD-10-CM

## 2022-06-01 DIAGNOSIS — E7849 Other hyperlipidemia: Secondary | ICD-10-CM | POA: Diagnosis not present

## 2022-06-01 DIAGNOSIS — E559 Vitamin D deficiency, unspecified: Secondary | ICD-10-CM

## 2022-06-14 NOTE — Progress Notes (Signed)
Chief Complaint:   OBESITY Jay Ortiz is here to discuss his progress with his obesity treatment plan along with follow-up of his obesity related diagnoses. Jay Ortiz is on the Category 3 Plan and states he is following his eating plan approximately 75% of the time. Jay Ortiz states he is doing cardio and weights for 30-60 minutes 5 times per week.  Today's visit was #: 8 Starting weight: 256 lbs Starting date: 11/24/21 Today's weight: 206 lbs Today's date: 06/01/22 Total lbs lost to date: 50 Total lbs lost since last in-office visit: -10  Interim History: Doing well with meal plan.  Denies hunger or cravings.  Doing well with walking/running and added in weight training 3-4 times per week.  Endurance has improved.  Subjective:   1. Vitamin D deficiency Improving.  Vitamin D level went from 18.5 to 51. Finishing out prescription vitamin D every 14 days. Plans to change to over-the-counter vitamin D 4000 IU daily when he runs out.  2. Insulin resistance Improving. Fasting insulin went from 21 down to 12.9 with dietary change, exercise and weight loss.  3. Other hyperlipidemia Improving. LDL went from 141 down to 91 in 4 months with dietary changes, exercise and weight loss.  4. Transient total loss of muscle tone Muscle mass decreased 4 pounds in 4 weeks. Doing resistance training from home 3 times per week and added a 30 g protein shake daily.  Assessment/Plan:   1. Vitamin D deficiency Change to vitamin D3 4000 IU daily.  Recheck level in 3 months.  2. Insulin resistance Continue a low sugar diet and workouts 5 days/week.  3. Other hyperlipidemia Continue prescribed diet.  4. Transient total loss of muscle tone Recommend seeing a trainer for weight training guidance.  5. Obesity, current BMI 29.7 1.  Continue category 3 meal plan plus a 30 g protein shake daily. 2.  Reviewed motivators to keep exercise fun.  Pinckney is currently in the action stage of change. As such, his  goal is to continue with weight loss efforts. He has agreed to the Category 3 Plan.   Exercise goals: as is  Behavioral modification strategies: increasing lean protein intake, increasing water intake, no skipping meals, meal planning and cooking strategies, keeping healthy foods in the home, and planning for success.  Emmet has agreed to follow-up with our clinic in 4 weeks. He was informed of the importance of frequent follow-up visits to maximize his success with intensive lifestyle modifications for his multiple health conditions.   Objective:   Blood pressure 111/87, pulse (!) 48, temperature 98.4 F (36.9 C), height 5' 10"$  (1.778 m), weight 206 lb (93.4 kg), SpO2 97 %. Body mass index is 29.56 kg/m.  General: Cooperative, alert, well developed, in no acute distress. HEENT: Conjunctivae and lids unremarkable. Cardiovascular: Regular rhythm.  Lungs: Normal work of breathing. Neurologic: No focal deficits.   Lab Results  Component Value Date   CREATININE 0.98 11/24/2021   BUN 14 11/24/2021   NA 137 11/24/2021   K 4.5 11/24/2021   CL 99 11/24/2021   CO2 23 11/24/2021   Lab Results  Component Value Date   ALT 22 11/24/2021   AST 21 11/24/2021   ALKPHOS 42 (L) 11/24/2021   BILITOT 0.6 11/24/2021   Lab Results  Component Value Date   HGBA1C 5.6 11/24/2021   Lab Results  Component Value Date   INSULIN 12.9 03/29/2022   Lab Results  Component Value Date   TSH 1.600 11/24/2021   Lab  Results  Component Value Date   CHOL 144 03/29/2022   HDL 33 (L) 03/29/2022   LDLCALC 91 03/29/2022   TRIG 105 03/29/2022   CHOLHDL 4.4 03/29/2022   Lab Results  Component Value Date   VD25OH 18.5 (L) 11/24/2021   Lab Results  Component Value Date   WBC 8.9 11/24/2021   HGB 16.1 11/24/2021   HCT 47.7 11/24/2021   MCV 88 11/24/2021   PLT 297 11/24/2021   No results found for: "IRON", "TIBC", "FERRITIN"   Attestation Statements:   Reviewed by clinician on day of visit:  allergies, medications, problem list, medical history, surgical history, family history, social history, and previous encounter notes.  I have personally spent 30 minutes total time today in preparation, patient care, nutritional counseling and documentation for this visit, including the following: review of clinical lab tests; review of medical tests/procedures/services.    I, Georgianne Fick, FNP, am acting as transcriptionist for Dr. Loyal Gambler.  I have reviewed the above documentation for accuracy and completeness, and I agree with the above. Dell Ponto, DO

## 2022-06-21 ENCOUNTER — Encounter (INDEPENDENT_AMBULATORY_CARE_PROVIDER_SITE_OTHER): Payer: Self-pay

## 2022-06-29 ENCOUNTER — Ambulatory Visit: Payer: Commercial Managed Care - PPO | Admitting: Internal Medicine

## 2022-06-29 ENCOUNTER — Ambulatory Visit (INDEPENDENT_AMBULATORY_CARE_PROVIDER_SITE_OTHER): Payer: Commercial Managed Care - PPO | Admitting: Family Medicine

## 2022-06-29 ENCOUNTER — Encounter (INDEPENDENT_AMBULATORY_CARE_PROVIDER_SITE_OTHER): Payer: Self-pay | Admitting: Family Medicine

## 2022-06-29 VITALS — BP 105/63 | HR 53 | Temp 98.0°F | Ht 70.0 in | Wt 200.0 lb

## 2022-06-29 DIAGNOSIS — Z6828 Body mass index (BMI) 28.0-28.9, adult: Secondary | ICD-10-CM

## 2022-06-29 DIAGNOSIS — G47411 Narcolepsy with cataplexy: Secondary | ICD-10-CM | POA: Diagnosis not present

## 2022-06-29 DIAGNOSIS — E559 Vitamin D deficiency, unspecified: Secondary | ICD-10-CM | POA: Diagnosis not present

## 2022-06-29 DIAGNOSIS — I10 Essential (primary) hypertension: Secondary | ICD-10-CM

## 2022-06-29 MED ORDER — LISINOPRIL 40 MG PO TABS: ORAL_TABLET | ORAL | 3 refills | Status: DC

## 2022-06-29 NOTE — Assessment & Plan Note (Signed)
Last vitamin D Lab Results  Component Value Date   VD25OH 18.5 (L) 11/24/2021   He has completed prescription vitamin D 50,000 IU once weekly.  His vitamin D level did improve to 51 from 18.5.  Energy level has improved.  He may start over-the-counter vitamin D 4000 IU once daily.  Recheck level in 3 months.

## 2022-06-29 NOTE — Progress Notes (Signed)
Office: [phone]  /  Fax: [phone]  WEIGHT SUMMARY AND BIOMETRICS  Vitals Temp: 74 F (36.7 C) BP: 105/63 Pulse Rate: (!) 53 SpO2: 98 %   Anthropometric Measurements Height: '5\' 10"'$  (1.778 m) Weight: 200 lb (90.7 kg) BMI (Calculated): 28.7 Weight at Last Visit: 206lb Weight Lost Since Last Visit: 6lb Starting Weight: 256lb Total Weight Loss (lbs): 56 lb (25.4 kg)   Body Composition  Body Fat %: 24.5 % Fat Mass (lbs): 49.2 lbs Muscle Mass (lbs): 144 lbs Total Body Water (lbs): 109.6 lbs Visceral Fat Rating : 9   Other Clinical Data Fasting: no Labs: no Today's Visit #: 9 Starting Date: 11/24/21     HPI  Chief Complaint: OBESITY  Jay Ortiz is here to discuss his progress with his obesity treatment plan. He is on the the Category 3 Plan and states he is following his eating plan approximately 75 % of the time. He states he is exercising 30-60 minutes 5 times per week.   Interval History:  Since last office visit he is down 6 lb He is doing some resistance training and walking at home He will be getting a Investment banker, corporate for home use He did add in a protein shake 3-4 days/ wk He goes out to eat on occasion and has occasional ETOH His endurance is much improved  He has considered getting a trainer Will change to adding 30 g protein shake once a day Reviewed bioimpedance results since last visit: Down 2.4 pounds muscle mass, down 3.6 pounds body fat  Pharmacotherapy: none  PHYSICAL EXAM:  Blood pressure 105/63, pulse (!) 53, temperature 98 F (36.7 C), height '5\' 10"'$  (1.778 m), weight 200 lb (90.7 kg), SpO2 98 %. Body mass index is 28.7 kg/m.  General: He is overweight, cooperative, alert, well developed, and in no acute distress. PSYCH: Has normal mood, affect and thought process.   Lungs: Normal breathing effort, no conversational dyspnea.  DIAGNOSTIC DATA REVIEWED:  BMET    Component Value Date/Time   NA 137 11/24/2021 0955   K 4.5  11/24/2021 0955   CL 99 11/24/2021 0955   CO2 23 11/24/2021 0955   GLUCOSE 82 11/24/2021 0955   GLUCOSE 121 (H) 05/21/2017 0007   BUN 14 11/24/2021 0955   CREATININE 0.98 11/24/2021 0955   CALCIUM 9.8 11/24/2021 0955   GFRNONAA >60 05/21/2017 0007   GFRAA >60 05/21/2017 0007   Lab Results  Component Value Date   HGBA1C 5.6 11/24/2021   Lab Results  Component Value Date   INSULIN 12.9 03/29/2022   Lab Results  Component Value Date   TSH 1.600 11/24/2021   CBC    Component Value Date/Time   WBC 8.9 11/24/2021 0955   WBC 17.6 (H) 05/21/2017 0007   RBC 5.42 11/24/2021 0955   RBC 5.22 05/21/2017 0007   HGB 16.1 11/24/2021 0955   HCT 47.7 11/24/2021 0955   PLT 297 11/24/2021 0955   MCV 88 11/24/2021 0955   MCH 29.7 11/24/2021 0955   MCH 29.7 05/21/2017 0007   MCHC 33.8 11/24/2021 0955   MCHC 34.8 05/21/2017 0007   RDW 13.3 11/24/2021 0955   Iron Studies No results found for: "IRON", "TIBC", "FERRITIN", "IRONPCTSAT" Lipid Panel     Component Value Date/Time   CHOL 144 03/29/2022 0000   TRIG 105 03/29/2022 0000   HDL 33 (L) 03/29/2022 0000   CHOLHDL 4.4 03/29/2022 0000   LDLCALC 91 03/29/2022 0000   Hepatic Function Panel  Component Value Date/Time   PROT 7.0 11/24/2021 0955   ALBUMIN 4.7 11/24/2021 0955   AST 21 11/24/2021 0955   ALT 22 11/24/2021 0955   ALKPHOS 42 (L) 11/24/2021 0955   BILITOT 0.6 11/24/2021 0955      Component Value Date/Time   TSH 1.600 11/24/2021 0955   Nutritional Lab Results  Component Value Date   VD25OH 18.5 (L) 11/24/2021     ASSESSMENT AND PLAN  TREATMENT PLAN FOR OBESITY:  Recommended Dietary Goals  Jay Ortiz is currently in the action stage of change. As such, his goal is to continue weight management plan. He has agreed to the Category 3 Plan.  Behavioral Intervention  We discussed the following Behavioral Modification Strategies today: increasing lean protein intake, increasing vegetables, increasing fiber rich  foods, increasing water intake, and work on meal planning and easy cooking plans.  Additional resources provided today: NA  Recommended Physical Activity Goals  Jay Ortiz has been advised to work up to 150 minutes of moderate intensity aerobic activity a week and strengthening exercises 2-3 times per week for cardiovascular health, weight loss maintenance and preservation of muscle mass.   He has agreed to continue physical activity as is.  He plans increased resistance training to 20 minutes 3 days a week  Pharmacotherapy We discussed various medication options to help Jay Ortiz with his weight loss efforts and we both agreed to none.  ASSOCIATED CONDITIONS ADDRESSED TODAY  Essential hypertension Assessment & Plan: Blood pressure is improving and he is feeling some dizziness with standing.  He is currently taking lisinopril 40 mg daily and has not scheduled for follow-up with his PCP.  With his weight reduction, will reduce his lisinopril to one half tab once daily.  Continue working on healthy lifestyle changes.  Orders: -     Lisinopril; 1/2 tab po daily  Dispense: 90 tablet; Refill: 3  Morbid obesity (HCC)  BMI 28.0-28.9,adult  Vitamin D deficiency Assessment & Plan: Last vitamin D Lab Results  Component Value Date   VD25OH 18.5 (L) 11/24/2021   He has completed prescription vitamin D 50,000 IU once weekly.  His vitamin D level did improve to 51 from 18.5.  Energy level has improved.  He may start over-the-counter vitamin D 4000 IU once daily.  Recheck level in 3 months.   Transient total loss of muscle tone Assessment & Plan: Patient has added in a protein shake 20 to 30 g of protein most days of the week and has started adding in resistance training from home.  He is currently not interested in seeking out a personal trainer at the gym.  He has plans to start using Smith machine at home.  We set a goal for at least 20 minutes of resistance training 3 days a week in addition to  his cardio.  Will move his protein shake to 30 g protein shake every day of the week.  Reviewed his bioimpedance results.  He is still losing muscle mass, down 2.4 pounds in the past 4 weeks.  Follow-up in 8 weeks       Return in about 8 weeks (around 08/24/2022).Marland Kitchen He was informed of the importance of frequent follow up visits to maximize his success with intensive lifestyle modifications for his multiple health conditions.   ATTESTASTION STATEMENTS:  Reviewed by clinician on day of visit: allergies, medications, problem list, medical history, surgical history, family history, social history, and previous encounter notes.   I have personally spent 30 minutes total time today in  preparation, patient care, nutritional counseling and documentation for this visit, including the following: review of clinical lab tests; review of medical tests/procedures/services.      Dell Ponto, DO

## 2022-06-29 NOTE — Assessment & Plan Note (Signed)
Patient has added in a protein shake 20 to 30 g of protein most days of the week and has started adding in resistance training from home.  He is currently not interested in seeking out a personal trainer at the gym.  He has plans to start using Smith machine at home.  We set a goal for at least 20 minutes of resistance training 3 days a week in addition to his cardio.  Will move his protein shake to 30 g protein shake every day of the week.  Reviewed his bioimpedance results.  He is still losing muscle mass, down 2.4 pounds in the past 4 weeks.  Follow-up in 8 weeks

## 2022-06-29 NOTE — Assessment & Plan Note (Signed)
Blood pressure is improving and he is feeling some dizziness with standing.  He is currently taking lisinopril 40 mg daily and has not scheduled for follow-up with his PCP.  With his weight reduction, will reduce his lisinopril to one half tab once daily.  Continue working on healthy lifestyle changes.

## 2022-06-30 ENCOUNTER — Encounter (INDEPENDENT_AMBULATORY_CARE_PROVIDER_SITE_OTHER): Payer: Self-pay

## 2022-06-30 ENCOUNTER — Encounter: Payer: Self-pay | Admitting: Nurse Practitioner

## 2022-06-30 DIAGNOSIS — I10 Essential (primary) hypertension: Secondary | ICD-10-CM

## 2022-07-02 ENCOUNTER — Other Ambulatory Visit (HOSPITAL_BASED_OUTPATIENT_CLINIC_OR_DEPARTMENT_OTHER): Payer: Self-pay

## 2022-07-02 MED ORDER — LISINOPRIL 20 MG PO TABS
20.0000 mg | ORAL_TABLET | Freq: Every day | ORAL | 3 refills | Status: DC
  Filled 2022-07-02: qty 90, 90d supply, fill #0
  Filled 2022-10-05 (×2): qty 90, 90d supply, fill #1
  Filled 2022-12-29: qty 90, 90d supply, fill #2
  Filled 2023-03-30: qty 90, 90d supply, fill #3

## 2022-07-23 LAB — HEMOGLOBIN A1C: Hemoglobin A1C: 5.6

## 2022-07-23 LAB — BASIC METABOLIC PANEL
BUN: 20 (ref 4–21)
CO2: 22 (ref 13–22)
Chloride: 102 (ref 99–108)
Creatinine: 1 (ref 0.6–1.3)
Glucose: 90
Potassium: 4.7 mEq/L (ref 3.5–5.1)
Sodium: 140 (ref 137–147)

## 2022-07-23 LAB — CBC AND DIFFERENTIAL
HCT: 49 (ref 41–53)
Hemoglobin: 15.6 (ref 13.5–17.5)
Neutrophils Absolute: 4.2
Platelets: 286 10*3/uL (ref 150–400)
WBC: 7.2

## 2022-07-23 LAB — COMPREHENSIVE METABOLIC PANEL
Albumin: 4.4 (ref 3.5–5.0)
Calcium: 9.5 (ref 8.7–10.7)
Globulin: 1.8
eGFR: 101

## 2022-07-23 LAB — HEPATIC FUNCTION PANEL
ALT: 21 U/L (ref 10–40)
AST: 15 (ref 14–40)
Alkaline Phosphatase: 42 (ref 25–125)
Bilirubin, Total: 0.4

## 2022-07-23 LAB — TSH: TSH: 2.15 (ref 0.41–5.90)

## 2022-07-23 LAB — LIPID PANEL
Cholesterol: 153 (ref 0–200)
HDL: 48 (ref 35–70)
LDL Cholesterol: 95
Triglycerides: 49 (ref 40–160)

## 2022-07-23 LAB — CBC: RBC: 5.37 — AB (ref 3.87–5.11)

## 2022-08-18 ENCOUNTER — Ambulatory Visit: Payer: Commercial Managed Care - PPO | Admitting: Family Medicine

## 2022-08-24 ENCOUNTER — Encounter (INDEPENDENT_AMBULATORY_CARE_PROVIDER_SITE_OTHER): Payer: Self-pay | Admitting: Family Medicine

## 2022-08-24 ENCOUNTER — Ambulatory Visit (INDEPENDENT_AMBULATORY_CARE_PROVIDER_SITE_OTHER): Payer: Commercial Managed Care - PPO | Admitting: Family Medicine

## 2022-08-24 VITALS — BP 105/67 | HR 49 | Temp 97.9°F | Ht 70.0 in | Wt 189.0 lb

## 2022-08-24 DIAGNOSIS — Z6827 Body mass index (BMI) 27.0-27.9, adult: Secondary | ICD-10-CM

## 2022-08-24 DIAGNOSIS — E559 Vitamin D deficiency, unspecified: Secondary | ICD-10-CM

## 2022-08-24 DIAGNOSIS — G47411 Narcolepsy with cataplexy: Secondary | ICD-10-CM

## 2022-08-24 DIAGNOSIS — I10 Essential (primary) hypertension: Secondary | ICD-10-CM

## 2022-08-24 NOTE — Assessment & Plan Note (Signed)
He reduced his Lisinopril from 40 mg --> 20 mg after seeing a drop in BP with weight loss/ healthy dietary changes.  His BP is still on the low side of normal today at 105/67.  He is no longer feeling orthostatic with exercise and has been working on improving his hydration with water.    F/u with PCP.  Consider reducing Lisinopril down to 10 mg daily Congratulated him on reducing his BP with weight loss

## 2022-08-24 NOTE — Assessment & Plan Note (Signed)
Last vitamin D Lab Results  Component Value Date   VD25OH 18.5 (L) 11/24/2021   He did see improvements in Vitamin D levels from 18.5 to 51.  Energy level has improved.  He is less consistent with taking a Vitamin D 4,000 IU daily supplement.  I did recommend a Men's MVI daily and we will recheck his Vitamin D level in June.

## 2022-08-24 NOTE — Assessment & Plan Note (Signed)
Jay Ortiz continues to lose muscle mass though he did increase his protein intake by adding a 30 g protein shake to his daily routine AND he is using a Solicitor at home for resistance training 3 x a week.    We discussed reducing his calorie deficit to help save some of his lean muscle mass now that his BMI is 27 and his % body fat is 22. Will focus on building back lean muscle mass now and will repeat his IC test in 2 mos to make sure that his metabolic rate has not slowed much.

## 2022-08-24 NOTE — Progress Notes (Signed)
Office: [phone]  /  Fax: [phone]  WEIGHT SUMMARY AND BIOMETRICS  Starting Date: 11/24/21  Starting Weight: 256lb   Weight Lost Since Last Visit: 11lb   Vitals Temp: 97.9 F (36.6 C) BP: 105/67 Pulse Rate: (!) 49 SpO2: 97 %   Body Composition  Body Fat %: 22 % Fat Mass (lbs): 41.8 lbs Muscle Mass (lbs): 140.4 lbs Total Body Water (lbs): 16.6 lbs Visceral Fat Rating : 7    HPI  Chief Complaint: OBESITY  Sabastien is here to discuss his progress with his obesity treatment plan. He is on the the Category 3 Plan and states he is following his eating plan approximately 80 % of the time. He states he is exercising 30-60 minutes 5-6 times per week.   Interval History:  Since last office visit he is down 11 lb.   He is down 3.6 lb of muscle mass and is down 7.8 lb of body fat in the past 2 mos He added in use of the smith machine 3 x a week and is walking 5 days/ wk He has lost 67 lb in the past 8 mos This is a 26% TBW loss He is averaging 1800 cal/ day and 100 g of protein or more daily He denies excess hunger or cravings  To reduce his caloric deficit and slow down further weight loss,  will increase his calories to 2000-2200 daily.  He should be consuming 40% carb, 35% protein, 25% fat Recommended increasing fruit to 2 servings per day, adding one serving of healthy fat daily and one serving of high fiber carb to dinner.  Repeat IC next visit  Pharmacotherapy: none  PHYSICAL EXAM:  Blood pressure 105/67, pulse (!) 49, temperature 97.9 F (36.6 C), height  (1.778 m), weight 189 lb (85.7 kg), SpO2 97 %. Body mass index is 27.12 kg/m.  General: He is overweight, cooperative, alert, well developed, and in no acute distress. PSYCH: Has normal mood, affect and thought process.   Lungs: Normal breathing effort, no conversational dyspnea.  ASSESSMENT AND PLAN  TREATMENT PLAN FOR OBESITY:  Recommended Dietary Goals  Jay Ortiz is currently in the action  stage of change. As such, his goal is to continue weight management plan. He has agreed to keeping a food journal and adhering to recommended goals of 2000 calories and 110 protein.  Behavioral Intervention  We discussed the following Behavioral Modification Strategies today: increasing lean protein intake, decreasing simple carbohydrates , increasing vegetables, increasing lower glycemic fruits, increasing fiber rich foods, avoiding skipping meals, increasing water intake, work on tracking and journaling calories using tracking application, continue to practice mindfulness when eating, planning for success, and better snacking choices.  Additional resources provided today: NA  Recommended Physical Activity Goals  Jay Ortiz has been advised to work up to 150 minutes of moderate intensity aerobic activity a week and strengthening exercises 2-3 times per week for cardiovascular health, weight loss maintenance and preservation of muscle mass.   He has agreed to Work on scheduling and tracking physical activity.   Pharmacotherapy changes for the treatment of obesity: none  ASSOCIATED CONDITIONS ADDRESSED TODAY  Essential hypertension Assessment & Plan: He reduced his Lisinopril from 40 mg --> 20 mg after seeing a drop in BP with weight loss/ healthy dietary changes.  His BP is still on the low side of normal today at 105/67.  He is no longer feeling orthostatic with exercise and has been working on improving his hydration with water.  F/u with PCP.  Consider reducing Lisinopril down to 10 mg daily Congratulated him on reducing his BP with weight loss   Morbid obesity  BMI 27.0-27.9,adult  Vitamin D deficiency Assessment & Plan: Last vitamin D Lab Results  Component Value Date   VD25OH 18.5 (L) 11/24/2021   He did see improvements in Vitamin D levels from 18.5 to 51.  Energy level has improved.  He is less consistent with taking a Vitamin D 4,000 IU daily supplement.  I did recommend  a Men's MVI daily and we will recheck his Vitamin D level in June.   Transient total loss of muscle tone Assessment & Plan: Jay Ortiz continues to lose muscle mass though he did increase his protein intake by adding a 30 g protein shake to his daily routine AND he is using a Solicitor at home for resistance training 3 x a week.    We discussed reducing his calorie deficit to help save some of his lean muscle mass now that his BMI is 27 and his % body fat is 22. Will focus on building back lean muscle mass now and will repeat his IC test in 2 mos to make sure that his metabolic rate has not slowed much.       He was informed of the importance of frequent follow up visits to maximize his success with intensive lifestyle modifications for his multiple health conditions.   ATTESTASTION STATEMENTS:  Reviewed by clinician on day of visit: allergies, medications, problem list, medical history, surgical history, family history, social history, and previous encounter notes pertinent to obesity diagnosis.   I have personally spent 30 minutes total time today in preparation, patient care, nutritional counseling and documentation for this visit, including the following: review of clinical lab tests; review of medical tests/procedures/services.      Jay Brink, DO DABFM, DABOM [facility] [address] [place], [place] [address] [phone]

## 2022-08-25 ENCOUNTER — Ambulatory Visit: Payer: Commercial Managed Care - PPO | Admitting: Family Medicine

## 2022-08-25 ENCOUNTER — Encounter: Payer: Self-pay | Admitting: Family Medicine

## 2022-08-25 VITALS — BP 102/60 | HR 55 | Temp 97.8°F | Ht 70.0 in | Wt 194.5 lb

## 2022-08-25 DIAGNOSIS — M25561 Pain in right knee: Secondary | ICD-10-CM | POA: Diagnosis not present

## 2022-08-25 NOTE — Progress Notes (Unsigned)
Jay Bartles T. Blaike Vickers, MD, CAQ Sports Medicine [facility] at [facility] [address] [place], [address]  Phone: [phone]  FAX: [phone]  Jay Ortiz - 37 y.o. male  MRN [id number]  Date of Birth: [birth year]  Date: 08/25/2022  PCP: Tollie Eth, NP  Referral: Tollie Eth, NP  Chief Complaint  Patient presents with   Knee Pain    Right   Subjective:   Jay Ortiz is a 37 y.o. very pleasant male patient with Body mass index is 27.91 kg/m. who presents with the following:  Pleasant gentleman presents with some R sided knee pain: He has not had a specific injury, but he does have some anterior knee pain that is noticed mostly at the beginning of his runs for the first 5 to 10 minutes.  Antero medial knee pain, no injury. Took a break, still with some pain.  Tried NSAIDS Past two days it did feel better.  He is still able to jog, and he is running roughly 5K.  No effusion.  A little bit after jogging.  Running 3 miles at a time right.  Goal is to run a 5k.   No old knee injuries.  No old surgery.   He has not had any locking up of the joint or symptomatic giving way.  No falls.   Review of Systems is noted in the HPI, as appropriate  Objective:   BP 102/60   Pulse (!) 55   Temp 97.8 F (36.6 C) (Temporal)   Ht  (1.778 m)   Wt 194 lb 8 oz (88.2 kg)   SpO2 98%   BMI 27.91 kg/m   GEN: No acute distress; alert,appropriate. PULM: Breathing comfortably in no respiratory distress PSYCH: Normally interactive.   Right knee: Full extension.  Flexion to 135.  Minimal pain with loading the medial lateral patellar facets.  Negative apprehension.  I do not appreciate any significant painful plica.  No significant joint line pain medially or laterally. Stable to varus and valgus stress.  Lachman is negative as well as anterior and posterior drawer testing.  Nontender at the patellar tendon as well as the quad tendon.  Good  quadriceps tone.  Bounce home testing, McMurray's, and flexion pinch are all negative. Minimal grind.  Laboratory and Imaging Data:  Assessment and Plan:     ICD-10-CM   1. Acute pain of right knee  M25.561      Overall, he has a very reassuring knee exam, and I cannot really elicit any pain.  By history and location, it seems like he is having some patellofemoral pain syndrome.  I think he can run as able based on pain.  Thank the doing some basic quadricep and hip strengthening would make some sense, 2.  A patellofemoral brace I think would help be helpful with running.  I reviewed with him a Bauernfeind comparable brace.  I think that a similar brace to a Genutrain would be helpful in this case.  Disposition: No follow-ups on file.  [employer] speech-to-text software was used for transcription in this dictation.  Possible transcriptional errors can occur using Animal nutritionist.   Signed,  Elpidio Galea. Crickett Abbett, MD   Outpatient Encounter Medications as of 08/25/2022  Medication Sig   hydrOXYzine (VISTARIL) 25 MG capsule Take 1 capsule (25 mg total) by mouth 2 (two) times daily as needed.   levocetirizine (XYZAL) 5 MG tablet Take 1 tablet (5 mg total) by  mouth every evening.   lisinopril (ZESTRIL) 20 MG tablet Take 1 tablet (20 mg total) by mouth daily.   ondansetron (ZOFRAN) 8 MG tablet Take 1 tablet (8 mg total) by mouth every 8 (eight) hours as needed for nausea or vomiting.   No facility-administered encounter medications on file as of 08/25/2022.

## 2022-08-26 ENCOUNTER — Other Ambulatory Visit (HOSPITAL_BASED_OUTPATIENT_CLINIC_OR_DEPARTMENT_OTHER): Payer: Self-pay

## 2022-09-03 ENCOUNTER — Other Ambulatory Visit (HOSPITAL_BASED_OUTPATIENT_CLINIC_OR_DEPARTMENT_OTHER): Payer: Self-pay

## 2022-09-06 ENCOUNTER — Other Ambulatory Visit (HOSPITAL_BASED_OUTPATIENT_CLINIC_OR_DEPARTMENT_OTHER): Payer: Self-pay

## 2022-10-05 ENCOUNTER — Other Ambulatory Visit (HOSPITAL_BASED_OUTPATIENT_CLINIC_OR_DEPARTMENT_OTHER): Payer: Self-pay

## 2022-10-05 ENCOUNTER — Other Ambulatory Visit: Payer: Self-pay

## 2022-10-11 ENCOUNTER — Other Ambulatory Visit (HOSPITAL_BASED_OUTPATIENT_CLINIC_OR_DEPARTMENT_OTHER): Payer: Self-pay

## 2022-10-12 ENCOUNTER — Ambulatory Visit (INDEPENDENT_AMBULATORY_CARE_PROVIDER_SITE_OTHER): Payer: Commercial Managed Care - PPO | Admitting: Family Medicine

## 2022-10-12 ENCOUNTER — Encounter (INDEPENDENT_AMBULATORY_CARE_PROVIDER_SITE_OTHER): Payer: Self-pay | Admitting: Family Medicine

## 2022-10-12 VITALS — BP 116/69 | HR 48 | Temp 98.5°F | Ht 70.0 in | Wt 188.0 lb

## 2022-10-12 DIAGNOSIS — Z6826 Body mass index (BMI) 26.0-26.9, adult: Secondary | ICD-10-CM

## 2022-10-12 DIAGNOSIS — G47411 Narcolepsy with cataplexy: Secondary | ICD-10-CM

## 2022-10-12 DIAGNOSIS — E88819 Insulin resistance, unspecified: Secondary | ICD-10-CM | POA: Diagnosis not present

## 2022-10-12 DIAGNOSIS — R0602 Shortness of breath: Secondary | ICD-10-CM | POA: Diagnosis not present

## 2022-10-12 HISTORY — DX: Shortness of breath: R06.02

## 2022-10-12 NOTE — Assessment & Plan Note (Signed)
Reviewed bioimpedance results.  He is focused now on regaining muscle strength.  He is starting to see muscle gain getting in approximately 120 g of protein daily or more.  He is logging his daily dietary intake.  He has lost a total of 68 pounds in the past 11 months of medically supervised weight management.  This is a 26.5% total body weight loss.  He has not needed use of antiobesity medications.  He is diligent about logging his dietary intake and has adjusted his calories to 2200 cal/day with 35% protein intake daily.  He is doing cardio 3 to 5 days a week including walking and running as well as weight training 2 days a week.  Now that his percent body fat is at 21% and he is happy at his current weight, will focus on maintaining his current body weight around 188 pounds, reducing body fat while building muscle mass back up with a target goal muscle mass 150 pounds.  Will keep his total daily calories around 2200 to 2400/day for maintenance.

## 2022-10-12 NOTE — Assessment & Plan Note (Signed)
He has a history of insulin resistance with his last fasting insulin under 15.  He has been following a low sugar/low starch diet and has lost 68 pounds in the past 11 months.  He is consistent with regular exercise.  Plan to obtain a fasting insulin level in the next 3 months.

## 2022-10-12 NOTE — Assessment & Plan Note (Signed)
Improving.  He is starting to build back muscle mass as he is creating less of a caloric deficit and is focused on getting at least 35% protein intake.  He is getting protein with meals and snacks.  He is doing weight training at least 2 days a week.  Continue current dietary intake.  Adjust calories up to 2200 to 2400/day, reducing the caloric deficit.  This should aid in building more muscle mass back.

## 2022-10-12 NOTE — Progress Notes (Signed)
Office: [phone]  /  Fax: [phone]  WEIGHT SUMMARY AND BIOMETRICS  Starting Date: 11/24/21  Starting Weight: 256lb   Weight Lost Since Last Visit: 1lb   Vitals Temp: 98.5 F (36.9 C) BP: 116/69 Pulse Rate: (!) 48 SpO2: 100 %   Body Composition  Body Fat %: 21 % Fat Mass (lbs): 39.4 lbs Muscle Mass (lbs): 141.2 lbs Total Body Water (lbs): 104.4 lbs Visceral Fat Rating : 7   HPI  Chief Complaint: OBESITY  Jay Ortiz is here to discuss his progress with his obesity treatment plan. He is on the the Category 3 Plan and states he is following his eating plan approximately 85-90 % of the time. He states he is exercising 30-60 minutes 5-6 times per week.   Interval History:  Since last office visit he is down 1 lb He is up 0.8 pounds of muscle mass and down 2.4 pounds of body fat He has increased his kcal to 2000-2200 cal/ day, logging on the [employer] His net weight loss if 256 -- 188 lb He just came back from vacation and did indulge more with social drinking and dessert He is happy with his weight loss He is walking, running and weight training He added more fruits, fiber and healthy fats last visit Denies sugar cravings   Pharmacotherapy: None  PHYSICAL EXAM:  Blood pressure 116/69, pulse (!) 48, temperature 98.5 F (36.9 C), height 5\' 10"  (1.778 m), weight 188 lb (85.3 kg), SpO2 100 %. Body mass index is 26.98 kg/m.  General: He is normal weight, cooperative, alert, well developed, and in no acute distress. PSYCH: Has normal mood, affect and thought process.   Lungs: Normal breathing effort, no conversational dyspnea.   ASSESSMENT AND PLAN  TREATMENT PLAN FOR OBESITY:  Recommended Dietary Goals  Jay Ortiz is currently in the action stage of change. As such, his goal is to continue weight management plan. He has agreed to keeping a food journal and adhering to recommended goals of 2200-2400 calories and 130+ grams of protein.  Behavioral  Intervention  We discussed the following Behavioral Modification Strategies today: increasing lean protein intake, decreasing simple carbohydrates , increasing vegetables, increasing lower glycemic fruits, increasing fiber rich foods, avoiding skipping meals, increasing water intake, continue to practice mindfulness when eating, and planning for success.  Additional resources provided today: NA  Recommended Physical Activity Goals  Jay Ortiz has been advised to work up to 150 minutes of moderate intensity aerobic activity a week and strengthening exercises 2-3 times per week for cardiovascular health, weight loss maintenance and preservation of muscle mass.   He has agreed to Continue current level of physical activity   Pharmacotherapy changes for the treatment of obesity: None  ASSOCIATED CONDITIONS ADDRESSED TODAY  SOBOE (shortness of breath on exertion) Assessment & Plan: IC repeated today, compared to initial IC July 2023 RMR dropped minimally from 2088 cal/ day to 2074 cal/ day ( a 14 cal per day drop) following a 68 lb weight loss Labs are reviewed and UTD  Adjusted kcal to maintenance levels based on new IC result    Morbid obesity (HCC) with starting BMI 37 Assessment & Plan: Reviewed bioimpedance results.  He is focused now on regaining muscle strength.  He is starting to see muscle gain getting in approximately 120 g of protein daily or more.  He is logging his daily dietary intake.  He has lost a total of 68 pounds in the past 11 months of medically supervised weight management.  This is a 26.5% total body weight loss.  He has not needed use of antiobesity medications.  He is diligent about logging his dietary intake and has adjusted his calories to 2200 cal/day with 35% protein intake daily.  He is doing cardio 3 to 5 days a week including walking and running as well as weight training 2 days a week.  Now that his percent body fat is at 21% and he is happy at his current  weight, will focus on maintaining his current body weight around 188 pounds, reducing body fat while building muscle mass back up with a target goal muscle mass 150 pounds.  Will keep his total daily calories around 2200 to 2400/day for maintenance.   BMI 26.0-26.9,adult  Insulin resistance Assessment & Plan: He has a history of insulin resistance with his last fasting insulin under 15.  He has been following a low sugar/low starch diet and has lost 68 pounds in the past 11 months.  He is consistent with regular exercise.  Plan to obtain a fasting insulin level in the next 3 months.   Transient total loss of muscle tone Assessment & Plan: Improving.  He is starting to build back muscle mass as he is creating less of a caloric deficit and is focused on getting at least 35% protein intake.  He is getting protein with meals and snacks.  He is doing weight training at least 2 days a week.  Continue current dietary intake.  Adjust calories up to 2200 to 2400/day, reducing the caloric deficit.  This should aid in building more muscle mass back.       He was informed of the importance of frequent follow up visits to maximize his success with intensive lifestyle modifications for his multiple health conditions.   ATTESTASTION STATEMENTS:  Reviewed by clinician on day of visit: allergies, medications, problem list, medical history, surgical history, family history, social history, and previous encounter notes pertinent to obesity diagnosis.   I have personally spent 30 minutes total time today in preparation, patient care, nutritional counseling and documentation for this visit, including the following: review of clinical lab tests; review of medical tests/procedures/services.      [name], DO DABFM, DABOM [facility] [address] [place], [place] [address] [phone]

## 2022-10-12 NOTE — Assessment & Plan Note (Signed)
IC repeated today, compared to initial IC July 2023 RMR dropped minimally from 2088 cal/ day to 2074 cal/ day ( a 14 cal per day drop) following a 68 lb weight loss Labs are reviewed and UTD  Adjusted kcal to maintenance levels based on new IC result

## 2022-12-01 ENCOUNTER — Telehealth: Payer: Self-pay | Admitting: Primary Care

## 2022-12-01 ENCOUNTER — Other Ambulatory Visit: Payer: Self-pay | Admitting: Primary Care

## 2022-12-01 DIAGNOSIS — L02416 Cutaneous abscess of left lower limb: Secondary | ICD-10-CM

## 2022-12-01 MED ORDER — DOXYCYCLINE HYCLATE 100 MG PO TABS: 100.0000 mg | ORAL_TABLET | Freq: Two times a day (BID) | ORAL | 0 refills | Status: DC

## 2022-12-01 MED ORDER — DOXYCYCLINE HYCLATE 100 MG PO TABS
100.0000 mg | ORAL_TABLET | Freq: Two times a day (BID) | ORAL | 0 refills | Status: DC
Start: 2022-12-01 — End: 2022-12-01

## 2022-12-01 NOTE — Telephone Encounter (Signed)
Spoke with patient via phone. Abscess located to left proximal medial thigh which began 1 week ago with skin irritation. He has been running several days during the week for the last 3-4 months. Over the last week his wound has progressed in size with erythema, warmth, and progressing abscess.   Will treat with Doxycycline 100 mg BID x 7 days. He will update if no improvement.

## 2022-12-14 ENCOUNTER — Ambulatory Visit (INDEPENDENT_AMBULATORY_CARE_PROVIDER_SITE_OTHER): Payer: Commercial Managed Care - PPO | Admitting: Family Medicine

## 2022-12-14 ENCOUNTER — Encounter (INDEPENDENT_AMBULATORY_CARE_PROVIDER_SITE_OTHER): Payer: Self-pay | Admitting: Family Medicine

## 2022-12-14 VITALS — BP 104/62 | HR 42 | Ht 70.0 in | Wt 180.0 lb

## 2022-12-14 DIAGNOSIS — Z6825 Body mass index (BMI) 25.0-25.9, adult: Secondary | ICD-10-CM | POA: Diagnosis not present

## 2022-12-14 DIAGNOSIS — R001 Bradycardia, unspecified: Secondary | ICD-10-CM | POA: Diagnosis not present

## 2022-12-14 DIAGNOSIS — G47411 Narcolepsy with cataplexy: Secondary | ICD-10-CM

## 2022-12-14 DIAGNOSIS — I10 Essential (primary) hypertension: Secondary | ICD-10-CM

## 2022-12-14 DIAGNOSIS — M629 Disorder of muscle, unspecified: Secondary | ICD-10-CM | POA: Diagnosis not present

## 2022-12-14 NOTE — Assessment & Plan Note (Signed)
Resting HR is now down in the 40s and continues to run low.  He reports a lowest reading of 39 bpm late at night.  Denies syncope, CP or DOE and has never had a workup for this.  EKG July 2023 was negative for bradycardia or heart block.  He is not on any rate- lowering medications.  He has been able to ramp up his running time.  Referral made to cardiology for further evaluation and treatment.

## 2022-12-14 NOTE — Assessment & Plan Note (Signed)
BP is running low even with use of Lisinopril 20 mg daily, down from 40 mg daily. He has lost 29.6% TBW in the past one year.  He is consuming a low sodium/ heart healthy diet.  He is starting to have some positional orthostasis.  Will cc his PCP about stopping his anti hypertensive medication and following BP readings.

## 2022-12-14 NOTE — Progress Notes (Signed)
Office: [phone]  /  Fax: [phone]  WEIGHT SUMMARY AND BIOMETRICS  Starting Date: 11/24/21  Starting Weight: 256lb   Weight Lost Since Last Visit: 8lb   Vitals BP: 104/62 Pulse Rate: (!) 42 SpO2: 100 %   Body Composition  Body Fat %: 19.7 % Fat Mass (lbs): 35.6 lbs Muscle Mass (lbs): 138 lbs Total Body Water (lbs): 105.4 lbs Visceral Fat Rating : 6   HPI  Chief Complaint: OBESITY  Camara is here to discuss his progress with his obesity treatment plan. He is on the the Category 3 Plan and states he is following his eating plan approximately 85 % of the time. He states he is exercising 30-60 minutes 4-5 times per week.   Interval History:  Since last office visit he is down 8 lb He is down 3.2 lb of muscle and is down 3.8 lb of muscle in the past 2 mos This gives him a net weight loss of 76 lb in the past one year of medically supervised weight management. That is a 29.6% TBW loss He is running and lifting weights for a total of 4-5 x a week He is getting 2250 cal/ day, logging daily.  He did increase his calories in June He is getting 160-200 g of protein daily and denies hunger or cravings He is trying to build muscle back (has lost 32% muscle mass) He is happy at this weight  Pharmacotherapy: none  PHYSICAL EXAM:  Blood pressure 104/62, pulse (!) 42, height 5\' 10"  (1.778 m), weight 180 lb (81.6 kg), SpO2 100%. Body mass index is 25.83 kg/m.  General: He is overweight, cooperative, alert, well developed, and in no acute distress. PSYCH: Has normal mood, affect and thought process.   Lungs: Normal breathing effort, no conversational dyspnea.   ASSESSMENT AND PLAN  TREATMENT PLAN FOR OBESITY:  Recommended Dietary Goals  Ashir is currently in the action stage of change. As such, his goal is to continue weight management plan. He has agreed to keeping a food journal and adhering to recommended goals of 2400 calories and 150 g of   protein.  Behavioral Intervention  We discussed the following Behavioral Modification Strategies today: increasing lean protein intake, decreasing simple carbohydrates , increasing vegetables, increasing lower glycemic fruits, increasing water intake, work on meal planning and preparation, continue to practice mindfulness when eating, and planning for success.  Additional resources provided today: NA  Recommended Physical Activity Goals  Donta has been advised to work up to 150 minutes of moderate intensity aerobic activity a week and strengthening exercises 2-3 times per week for cardiovascular health, weight loss maintenance and preservation of muscle mass.   He has agreed to Increase the intensity, frequency or duration of strengthening exercises   Pharmacotherapy changes for the treatment of obesity: none  ASSOCIATED CONDITIONS ADDRESSED TODAY  Bradycardia Assessment & Plan: Resting HR is now down in the 40s and continues to run low.  He reports a lowest reading of 39 bpm late at night.  Denies syncope, CP or DOE and has never had a workup for this.  EKG July 2023 was negative for bradycardia or heart block.  He is not on any rate- lowering medications.  He has been able to ramp up his running time.  Referral made to cardiology for further evaluation and treatment.  Orders: -     Ambulatory referral to Cardiology  Morbid obesity (HCC) with starting BMI 37  BMI 25.0-25.9,adult  Transient total loss of muscle tone  Assessment & Plan: [name] has lost 24.6 lb of muscle mass in the past year out of a total of 76 lb lost.  This is a 32% loss of muscle mass.  This is above the recommended 25% loss and has not improved with ramping up protein intake.  He is a bit over the target for protein intake.  Macro's changed to 40% carb, 30% protein, 30% fat to help fuel workouts.  He has added in weight training but it appears that he is creating too much of a caloric deficit to build muscle,  especially given her increased cardio.    Increased kcal to 2400 per day and adjusted macros Allow one cheat meal per week Keep sweets to a minimum Weight training 3 x a week     Essential hypertension Assessment & Plan: BP is running low even with use of Lisinopril 20 mg daily, down from 40 mg daily. He has lost 29.6% TBW in the past one year.  He is consuming a low sodium/ heart healthy diet.  He is starting to have some positional orthostasis.  Will cc his PCP about stopping his anti hypertensive medication and following BP readings.       He was informed of the importance of frequent follow up visits to maximize his success with intensive lifestyle modifications for his multiple health conditions.   ATTESTASTION STATEMENTS:  Reviewed by clinician on day of visit: allergies, medications, problem list, medical history, surgical history, family history, social history, and previous encounter notes pertinent to obesity diagnosis.   I have personally spent 30 minutes total time today in preparation, patient care, nutritional counseling and documentation for this visit, including the following: review of clinical lab tests; review of medical tests/procedures/services.      [name], DO DABFM, DABOM [facility] [address] [place], [place] [address] [phone]

## 2022-12-14 NOTE — Assessment & Plan Note (Addendum)
Jay Ortiz has lost 24.6 lb of muscle mass in the past year out of a total of 76 lb lost.  This is a 32% loss of muscle mass.  This is above the recommended 25% loss and has not improved with ramping up protein intake.  He is a bit over the target for protein intake.  Macro's changed to 40% carb, 30% protein, 30% fat to help fuel workouts.  He has added in weight training but it appears that he is creating too much of a caloric deficit to build muscle, especially given her increased cardio.    Increased kcal to 2400 per day and adjusted macros Allow one cheat meal per week Keep sweets to a minimum Weight training 3 x a week

## 2023-01-18 ENCOUNTER — Encounter: Payer: Self-pay | Admitting: Nurse Practitioner

## 2023-01-18 ENCOUNTER — Other Ambulatory Visit (HOSPITAL_BASED_OUTPATIENT_CLINIC_OR_DEPARTMENT_OTHER): Payer: Self-pay

## 2023-01-18 ENCOUNTER — Ambulatory Visit (INDEPENDENT_AMBULATORY_CARE_PROVIDER_SITE_OTHER): Payer: Commercial Managed Care - PPO | Admitting: Nurse Practitioner

## 2023-01-18 VITALS — BP 118/78 | HR 45 | Ht 70.25 in | Wt 182.8 lb

## 2023-01-18 DIAGNOSIS — E559 Vitamin D deficiency, unspecified: Secondary | ICD-10-CM

## 2023-01-18 DIAGNOSIS — Z1159 Encounter for screening for other viral diseases: Secondary | ICD-10-CM

## 2023-01-18 DIAGNOSIS — E785 Hyperlipidemia, unspecified: Secondary | ICD-10-CM

## 2023-01-18 DIAGNOSIS — R001 Bradycardia, unspecified: Secondary | ICD-10-CM

## 2023-01-18 DIAGNOSIS — J302 Other seasonal allergic rhinitis: Secondary | ICD-10-CM

## 2023-01-18 DIAGNOSIS — Z0000 Encounter for general adult medical examination without abnormal findings: Secondary | ICD-10-CM

## 2023-01-18 DIAGNOSIS — I10 Essential (primary) hypertension: Secondary | ICD-10-CM | POA: Diagnosis not present

## 2023-01-18 MED ORDER — VITAMIN D (ERGOCALCIFEROL) 1.25 MG (50000 UNIT) PO CAPS
50000.0000 [IU] | ORAL_CAPSULE | ORAL | 3 refills | Status: DC
Start: 2023-01-18 — End: 2023-03-01
  Filled 2023-01-18: qty 12, 84d supply, fill #0

## 2023-01-18 NOTE — Patient Instructions (Signed)
For all adult patients, I recommend A well balanced diet low in saturated fats, cholesterol, and moderation in carbohydrates.   This can be as simple as monitoring portion sizes and cutting back on sugary beverages such as soda and juice to start with.    Daily water consumption of at least 64 ounces.  Physical activity at least 180 minutes per week, if just starting out.   This can be as simple as taking the stairs instead of the elevator and walking 2-3 laps around the office  purposefully every day.   STD protection, partner selection, and regular testing if high risk.  Limited consumption of alcoholic beverages if alcohol is consumed.  For women, I recommend no more than 7 alcoholic beverages per week, spread out throughout the week.  Avoid "binge" drinking or consuming large quantities of alcohol in one setting.   Please let me know if you feel you may need help with reduction or quitting alcohol consumption.   Avoidance of nicotine, if used.  Please let me know if you feel you may need help with reduction or quitting nicotine use.   Daily mental health attention.  This can be in the form of 5 minute daily meditation, prayer, journaling, yoga, reflection, etc.   Purposeful attention to your emotions and mental state can significantly improve your overall wellbeing  and  Health.  Please know that I am here to help you with all of your health care goals and am happy to work with you to find a solution that works best for you.  The greatest advice I have received with any changes in life are to take it one step at a time, that even means if all you can focus on is the next 60 seconds, then do that and celebrate your victories.  With any changes in life, you will have set backs, and that is OK. The important thing to remember is, if you have a set back, it is not a failure, it is an opportunity to try again!  Health Maintenance Recommendations Screening Testing Mammogram Every 1 -2  years based on history and risk factors Starting at age 54 Pap Smear Ages 21-39 every 3 years Ages 73-65 every 5 years with HPV testing More frequent testing may be required based on results and history Colon Cancer Screening Every 1-10 years based on test performed, risk factors, and history Starting at age 89 Bone Density Screening Every 2-10 years based on history Starting at age 46 for women Recommendations for men differ based on medication usage, history, and risk factors AAA Screening One time ultrasound Men 32-52 years old who have every smoked Lung Cancer Screening Low Dose Lung CT every 12 months Age 74-80 years with a 30 pack-year smoking history who still smoke or who have quit within the last 15 years  Screening Labs Routine  Labs: Complete Blood Count (CBC), Complete Metabolic Panel (CMP), Cholesterol (Lipid Panel) Every 6-12 months based on history and medications May be recommended more frequently based on current conditions or previous results Hemoglobin A1c Lab Every 3-12 months based on history and previous results Starting at age 63 or earlier with diagnosis of diabetes, high cholesterol, BMI >26, and/or risk factors Frequent monitoring for patients with diabetes to ensure blood sugar control Thyroid Panel (TSH w/ T3 & T4) Every 6 months based on history, symptoms, and risk factors May be repeated more often if on medication HIV One time testing for all patients 30 and older May be repeated  more frequently for patients with increased risk factors or exposure Hepatitis C One time testing for all patients 18 and older May be repeated more frequently for patients with increased risk factors or exposure Gonorrhea, Chlamydia Every 12 months for all sexually active persons 13-24 years Additional monitoring may be recommended for those who are considered high risk or who have symptoms PSA Men 60-47 years old with risk factors Additional screening may be  recommended from age 50-69 based on risk factors, symptoms, and history  Vaccine Recommendations Tetanus Booster All adults every 10 years Flu Vaccine All patients 6 months and older every year COVID Vaccine All patients 12 years and older Initial dosing with booster May recommend additional booster based on age and health history HPV Vaccine 2 doses all patients age 12-26 Dosing may be considered for patients over 26 Shingles Vaccine (Shingrix) 2 doses all adults 55 years and older Pneumonia (Pneumovax 23) All adults 65 years and older May recommend earlier dosing based on health history Pneumonia (Prevnar 70) All adults 65 years and older Dosed 1 year after Pneumovax 23  Additional Screening, Testing, and Vaccinations may be recommended on an individualized basis based on family history, health history, risk factors, and/or exposure.

## 2023-01-18 NOTE — Progress Notes (Unsigned)
BP 118/78   Pulse (!) 45   Ht 5' 10.25" (1.784 m)   Wt 182 lb 12.8 oz (82.9 kg)   BMI 26.04 kg/m    Subjective:    Patient ID: Jay Ortiz, male    DOB: 13-May-1985, 37 y.o.   MRN: [id number]  HPI: Jay Ortiz is a 37 y.o. male presenting on 01/18/2023 for comprehensive medical examination.   Current medical concerns include: {Blank single:19197::"none","***"}  {Ros; complete male:19585:a:"Pertinent items are noted in HPI."}  IMMUNIZATIONS:   Flu Vaccine: Flu vaccine declined, patient will complete later Prevnar 13: Prevnar 13 N/A for this patient Prevnar 20: Prevnar 20 N/A for this patient Pneumovax 23: Pneumovax 23 N/A for this patient Vac Shingrix: Shingrix N/A for this patient HPV: N/A or Aged Out Tetanus: Tetanus completed in the last 10 years COVID: COVID completed, documentation in chart  RSV: No  HEALTH MAINTENANCE: Colon Cancer Screening HM Status: N/A STI Testing HM Status: was declined  PSA ScreenHM Status: N/A Lung Ca Screen HM Status: N/A AAA Screen HM Status: N/A  Concerns with hearing, vision, or dentition: {Yes/No:[id number]}  Dietary Habits: Exercise: 15-20 miles a week running. Exercise 6-7 days a week.   Most Recent Depression Screen:     01/18/2023    3:06 PM 02/02/2022    3:10 PM 11/24/2021    8:44 AM 01/27/2021    2:40 PM  Depression screen PHQ 2/9  Decreased Interest 0 0 1 0  Down, Depressed, Hopeless 0 0 0 0  PHQ - 2 Score 0 0 1 0  Altered sleeping  0 1 1  Tired, decreased energy  1 1 0  Change in appetite  1 2 1   Feeling bad or failure about yourself   1 0 0  Trouble concentrating  1 1 0  Moving slowly or fidgety/restless  0 0 0  Suicidal thoughts  0 0 0  PHQ-9 Score  4 6 2   Difficult doing work/chores  Not difficult at all Not difficult at all Not difficult at all   Most Recent Anxiety Screen:    02/02/2022    3:11 PM 01/27/2021    2:40 PM  GAD 7 : Generalized Anxiety Score  Nervous, Anxious, on Edge 0 2  Control/stop  worrying 0 2  Worry too much - different things 1 1  Trouble relaxing 0 1  Restless 0 0  Easily annoyed or irritable 1 0  Afraid - awful might happen 0 0  Total GAD 7 Score 2 6  Anxiety Difficulty Not difficult at all Somewhat difficult   Most Recent Falls Screen:    01/18/2023    3:06 PM 08/25/2022   12:41 PM 02/02/2022    3:10 PM 01/27/2021    2:40 PM  Fall Risk   Falls in the past year? 0 0 0 0  Number falls in past yr: 0 0 0 0  Injury with Fall? 0 0 0 0  Risk for fall due to : No Fall Risks No Fall Risks No Fall Risks No Fall Risks  Follow up Falls evaluation completed Falls evaluation completed Falls evaluation completed;Education provided Falls evaluation completed    Past medical history, surgical history, medications, allergies, family history and social history reviewed with patient today and changes made to appropriate areas of the chart.  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   levocetirizine (XYZAL) 5 MG tablet Take 1 tablet (5 mg total) by mouth every evening.   lisinopril (ZESTRIL) 20 MG tablet Take 1 tablet (20 mg total) by mouth daily.   ondansetron (ZOFRAN) 8 MG tablet Take 1 tablet (8 mg total) by mouth every 8 (eight) hours as needed for nausea or vomiting.   No current facility-administered medications on file prior to visit.   Surgical History:  Past Surgical History:  Procedure Laterality Date   TONSILLECTOMY     Allergies:  No Known Allergies Social History:  Social History   Socioeconomic History   Marital status: Single     Spouse name: Not on file   Number of children: Not on file   Years of education: Not on file   Highest education level: Not on file  Occupational History   Not on file  Tobacco Use   Smoking status: Never   Smokeless tobacco: Never  Vaping Use   Vaping status: Never Used  Substance and Sexual Activity   Alcohol use: Yes    Comment: socially   Drug use: No   Sexual activity: Not on file  Other Topics Concern   Not on file  Social History Narrative   Not on file   Social Determinants of Health   Financial Resource Strain: Not on file  Food Insecurity: Not on file  Transportation Needs: Not on file  Physical Activity: Not on file  Stress: Not on file  Social Connections: Not on file  Intimate Partner Violence: Not on file   Social History   Tobacco Use  Smoking Status Never  Smokeless Tobacco Never   Social History   Substance and Sexual Activity  Alcohol Use Yes   Comment: socially   Family History:  Family History  Problem Relation Age of Onset   Ulcers Mother    Hypertension Father    Hyperlipidemia Father    CVA Maternal Grandmother    Diabetes Mellitus II Maternal Grandfather    Colon cancer Paternal Grandmother    Heart attack Paternal Grandfather        Objective:    BP 118/78   Pulse (!) 45   Ht 5' 10.25" (1.784 m)   Wt 182 lb 12.8 oz (82.9 kg)   BMI 26.04 kg/m   Wt Readings from Last 3 Encounters:  01/18/23 182 lb 12.8 oz (82.9 kg)  12/14/22 180 lb (81.6 kg)  10/12/22 188 lb (85.3 kg)    Physical Exam  Results for orders placed or performed in visit on 08/10/22  CBC and differential  Result Value Ref Range   Hemoglobin 15.6 13.5 - 17.5   HCT 49 41 - 53   Neutrophils Absolute 4.20    Platelets 286 150 - 400 K/uL   WBC 7.2   CBC  Result Value Ref Range   RBC 5.37 (A) 3.87 - 5.11  Basic metabolic panel  Result Value Ref Range   Glucose 90    BUN 20 4 - 21   CO2 22 13 - 22   Creatinine 1.0 0.6 - 1.3   Potassium 4.7 3.5 -  5.1 mEq/L   Sodium 140 137 -  147   Chloride 102 99 - 108  Comprehensive metabolic panel  Result Value Ref Range   Globulin 1.8    eGFR 101    Calcium 9.5 8.7 - 10.7   Albumin 4.4 3.5 - 5.0  Lipid panel  Result Value Ref Range   Triglycerides 49 40 - 160   Cholesterol 153 0 - 200   HDL 48 35 - 70   LDL Cholesterol 95   Hepatic function panel  Result Value Ref Range   Alkaline Phosphatase 42 25 - 125   ALT 21 10 - 40 U/L   AST 15 14 - 40   Bilirubin, Total 0.4   Hemoglobin A1c  Result Value Ref Range   Hemoglobin A1C 5.6   TSH  Result Value Ref Range   TSH 2.15 0.41 - 5.90      Assessment & Plan:   Problem List Items Addressed This Visit   None    Follow up plan: NEXT PREVENTATIVE PHYSICAL DUE IN 1 YEAR. No follow-ups on file.  LABORATORY TESTING:  Health maintenance labs ordered today, if applicable.    PATIENT COUNSELING:   For all adult patients, I recommend A well balanced diet low in saturated fats, cholesterol, and moderation in carbohydrates.  This can be as simple as monitoring portion sizes and cutting back on sugary beverages such as soda and juice to start with.    Daily water consumption of at least 64 ounces.  Physical activity at least 180 minutes per week, if just starting out.  This can be as simple as taking the stairs instead of the elevator and walking 2-3 laps around the office  purposefully every day.   STD protection, partner selection, and regular testing if high risk.  Limited consumption of alcoholic beverages if alcohol is consumed. For men, I recommend no more than 14 alcoholic beverages per week, spread out throughout the week (max 2 per day). Avoid "binge" drinking or consuming large quantities of alcohol in one setting.  Please let me know if you feel you may need help with reduction or quitting alcohol consumption.   Avoidance of nicotine, if used. Please let me know if you feel you may need help with reduction or quitting  nicotine use.   Daily mental health attention. This can be in the form of 5 minute daily meditation, prayer, journaling, yoga, reflection, etc.  Purposeful attention to your emotions and mental state can significantly improve your overall wellbeing  and  Health.  Please know that I am here to help you with all of your health care goals and am happy to work with you to find a solution that works best for you.  The greatest advice I have received with any changes in life are to take it one step at a time, that even means if all you can focus on is the next 60 seconds, then do that and celebrate your victories.  With any changes in life, you will have set backs, and that is OK. The important thing to remember is, if you have a set back, it is not a failure, it is an opportunity to try again!  Health Maintenance Recommendations Screening Testing Mammogram Every 1 -2 years based on history and risk factors Starting at age 4 Pap Smear Ages 21-39 every 3 years Ages 66-65 every 5 years with HPV testing More frequent testing may be required based on results and history Colon Cancer Screening Every 1-10 years based on test performed, risk factors,  and history Starting at age 4 Bone Density Screening Every 2-10 years based on history Starting at age 40 for women Recommendations for men differ based on medication usage, history, and risk factors AAA Screening One time ultrasound Men 23-82 years old who have every smoked Lung Cancer Screening Low Dose Lung CT every 12 months Age 68-80 years with a 30 pack-year smoking history who still smoke or who have quit within the last 15 years   Screening Labs Routine  Labs: Complete Blood Count (CBC), Complete Metabolic Panel (CMP), Cholesterol (Lipid Panel) Every 6-12 months based on history and medications May be recommended more frequently based on current conditions or previous results Hemoglobin A1c Lab Every 3-12 months based on history and  previous results Starting at age 32 or earlier with diagnosis of diabetes, high cholesterol, BMI >26, and/or risk factors Frequent monitoring for patients with diabetes to ensure blood sugar control Thyroid Panel (TSH) Every 6 months based on history, symptoms, and risk factors May be repeated more often if on medication HIV One time testing for all patients 35 and older May be repeated more frequently for patients with increased risk factors or exposure Hepatitis C One time testing for all patients 35 and older May be repeated more frequently for patients with increased risk factors or exposure Gonorrhea, Chlamydia Every 12 months for all sexually active persons 13-24 years Additional monitoring may be recommended for those who are considered high risk or who have symptoms Every 12 months for any woman on birth control, regardless of sexual activity PSA Men 23-38 years old with risk factors Additional screening may be recommended from age 19-69 based on risk factors, symptoms, and history  Vaccine Recommendations Tetanus Booster All adults every 10 years Flu Vaccine All patients 6 months and older every year COVID Vaccine All patients 12 years and older Initial dosing with booster May recommend additional booster based on age and health history HPV Vaccine 2 doses all patients age 70-26 Dosing may be considered for patients over 26 Shingles Vaccine (Shingrix) 2 doses all adults 55 years and older Pneumonia (Pneumovax 97) All adults 65 years and older May recommend earlier dosing based on health history One year apart from Prevnar 85 Pneumonia (Prevnar 43) All adults 65 years and older Dosed 1 year after Pneumovax 23 Pneumonia (Prevnar 20) One time alternative to the two dosing of 13 and 23 For all adults with initial dose of 23, 20 is recommended 1 year later For all adults with initial dose of 13, 23 is still recommended as second option 1 year later  Additional  Screening, Testing, and Vaccinations may be recommended on an individualized basis based on family history, health history, risk factors, and/or exposure.

## 2023-01-19 LAB — CBC WITH DIFFERENTIAL/PLATELET
Basophils Absolute: 0.1 10*3/uL (ref 0.0–0.2)
Basos: 1 %
EOS (ABSOLUTE): 0.1 10*3/uL (ref 0.0–0.4)
Eos: 2 %
Hematocrit: 46.4 % (ref 37.5–51.0)
Hemoglobin: 15.5 g/dL (ref 13.0–17.7)
Immature Grans (Abs): 0 10*3/uL (ref 0.0–0.1)
Immature Granulocytes: 0 %
Lymphocytes Absolute: 2.2 10*3/uL (ref 0.7–3.1)
Lymphs: 32 %
MCH: 30.6 pg (ref 26.6–33.0)
MCHC: 33.4 g/dL (ref 31.5–35.7)
MCV: 92 fL (ref 79–97)
Monocytes Absolute: 0.6 10*3/uL (ref 0.1–0.9)
Monocytes: 8 %
Neutrophils Absolute: 4 10*3/uL (ref 1.4–7.0)
Neutrophils: 57 %
Platelets: 251 10*3/uL (ref 150–450)
RBC: 5.07 x10E6/uL (ref 4.14–5.80)
RDW: 12.1 % (ref 11.6–15.4)
WBC: 7 10*3/uL (ref 3.4–10.8)

## 2023-01-19 LAB — LIPID PANEL
Chol/HDL Ratio: 2.5 ratio (ref 0.0–5.0)
Cholesterol, Total: 147 mg/dL (ref 100–199)
HDL: 59 mg/dL (ref >39)
LDL Chol Calc (NIH): 77 mg/dL (ref 0–99)
Triglycerides: 51 mg/dL (ref 0–149)
VLDL Cholesterol Cal: 11 mg/dL (ref 5–40)

## 2023-01-19 LAB — CMP14+EGFR
ALT: 27 IU/L (ref 0–44)
AST: 26 IU/L (ref 0–40)
Albumin: 4.4 g/dL (ref 4.1–5.1)
Alkaline Phosphatase: 42 IU/L — ABNORMAL LOW (ref 44–121)
BUN/Creatinine Ratio: 17 (ref 9–20)
BUN: 15 mg/dL (ref 6–20)
Bilirubin Total: 0.9 mg/dL (ref 0.0–1.2)
CO2: 24 mmol/L (ref 20–29)
Calcium: 9.5 mg/dL (ref 8.7–10.2)
Chloride: 101 mmol/L (ref 96–106)
Creatinine, Ser: 0.86 mg/dL (ref 0.76–1.27)
Globulin, Total: 1.8 g/dL (ref 1.5–4.5)
Glucose: 86 mg/dL (ref 70–99)
Potassium: 4.4 mmol/L (ref 3.5–5.2)
Sodium: 141 mmol/L (ref 134–144)
Total Protein: 6.2 g/dL (ref 6.0–8.5)
eGFR: 117 mL/min/{1.73_m2} (ref >59)

## 2023-01-19 LAB — HEPATITIS C ANTIBODY: Hep C Virus Ab: NONREACTIVE

## 2023-01-19 MED ORDER — LEVOCETIRIZINE DIHYDROCHLORIDE 5 MG PO TABS
5.0000 mg | ORAL_TABLET | Freq: Every evening | ORAL | 3 refills | Status: DC
  Filled 2023-01-19 – 2023-03-15 (×2): qty 90, 90d supply, fill #0
  Filled 2023-06-08 – 2023-06-27 (×3): qty 90, 90d supply, fill #1
  Filled 2023-09-22: qty 90, 90d supply, fill #2
  Filled 2023-12-30: qty 90, 90d supply, fill #3

## 2023-01-19 NOTE — Assessment & Plan Note (Signed)
Chronic. Currently well controlled with xyzal. Exam benign today with no concerns. Will refill medication and continue to monitor.

## 2023-01-19 NOTE — Assessment & Plan Note (Signed)
Patient has had significant weight loss with improved diet and exercise regimen. We will monitor labs today to ensure lipids are under control. At this time he is not on medication therapy. We can consider this if his lipids remain elevated.

## 2023-01-19 NOTE — Assessment & Plan Note (Signed)
Chronic low vitamin D. Refills sent on weekly high dose treatment for bone and muscle health.

## 2023-01-19 NOTE — Assessment & Plan Note (Signed)
BMI reduced from 36 to 26 in 12 months. He is working diligently on diet and exercise and has had tremendous success. Recommend continue with his regimen. Praised him for his efforts and success today.

## 2023-01-19 NOTE — Assessment & Plan Note (Signed)
Chronic. Currently very well controlled. He has been working on diet and exercises and has managed to taper down to 10mg  of lisinopril daily with blood pressures in the 110's. We discussed trial of holding the medication if he wishes to determine if he is able to discontinue this. Goal BP < 130/80. Will monitor. Labs pending.

## 2023-01-19 NOTE — Assessment & Plan Note (Signed)

## 2023-01-19 NOTE — Assessment & Plan Note (Signed)
Heart rate is regular and strong, but significant bradycardia is present in the office today. He is involved in regular cardiovascular exercise 4-6 days a week, which could account for the lower rate. He is awaiting an appointment with cardiology. He does occasionally experience symptoms of dizziness with bending over or sudden movement, however, he also has lower blood pressure readings. We will continue to monitor closely. He is stable in the office today.

## 2023-01-20 ENCOUNTER — Other Ambulatory Visit (HOSPITAL_BASED_OUTPATIENT_CLINIC_OR_DEPARTMENT_OTHER): Payer: Self-pay

## 2023-01-25 ENCOUNTER — Ambulatory Visit: Payer: Commercial Managed Care - PPO | Admitting: Family Medicine

## 2023-01-25 ENCOUNTER — Encounter: Payer: Self-pay | Admitting: Family Medicine

## 2023-01-25 VITALS — BP 123/75 | HR 48 | Ht 70.0 in | Wt 179.0 lb

## 2023-01-25 DIAGNOSIS — Z6825 Body mass index (BMI) 25.0-25.9, adult: Secondary | ICD-10-CM | POA: Diagnosis not present

## 2023-01-25 DIAGNOSIS — R001 Bradycardia, unspecified: Secondary | ICD-10-CM

## 2023-01-25 DIAGNOSIS — E559 Vitamin D deficiency, unspecified: Secondary | ICD-10-CM

## 2023-01-25 DIAGNOSIS — I10 Essential (primary) hypertension: Secondary | ICD-10-CM | POA: Diagnosis not present

## 2023-01-25 NOTE — Assessment & Plan Note (Signed)
Last vitamin D Lab Results  Component Value Date   VD25OH 18.5 (L) 11/24/2021   His PCP refilled his vitamin D RX weekly though his most recent level was 51. He has not been taking it  For maintenance of vitamin D, recommend 2,000 international units  daily with plans tor recheck in Jan

## 2023-01-25 NOTE — Progress Notes (Signed)
Office: [phone]  /  Fax: [phone]  WEIGHT SUMMARY AND BIOMETRICS  Starting Date: 11/24/21  Starting Weight: 256lb   Weight Lost Since Last Visit: 1lb   Vitals BP: 123/75 Pulse Rate: (!) 48 SpO2: 98 %   Body Composition  Body Fat %: 18 % Fat Mass (lbs): 32.2 lbs Muscle Mass (lbs): 139.6 lbs Total Body Water (lbs): 105.2 lbs Visceral Fat Rating : 6    HPI  Chief Complaint: OBESITY  Jay Ortiz is here to discuss his progress with his obesity treatment plan. He is on the the Category 3 Plan and states he is following his eating plan approximately 80-85 % of the time. He states he is exercising 45-60 minutes 6 times per week.   Interval History:  Since last office visit he is down 1 lb He is up 1.6 lb of muscle mass and is down 3.4 lb of body fat in the past 6 weeks He is did increase carbs to 40% and feels better He is running 5.5 miles 3 x a week and doing strength training 3 x a week He is happy with his results He is down a net 77 lb in the past year of medically supervised weight management with diet and exercise changes alone He is logging is calories, at 2400 per day for maintence  Pharmacotherapy: none  PHYSICAL EXAM:  Blood pressure 123/75, pulse (!) 48, height 5\' 10"  (1.778 m), weight 179 lb (81.2 kg), SpO2 98%. Body mass index is 25.68 kg/m.  General: He is normal weight, cooperative, alert, well developed, and in no acute distress. PSYCH: Has normal mood, affect and thought process.   Lungs: Normal breathing effort, no conversational dyspnea.   ASSESSMENT AND PLAN  TREATMENT PLAN FOR OBESITY:  Recommended Dietary Goals  Christapher is currently in the action stage of change. As such, his goal is to continue weight management plan. He has agreed to keeping a food journal and adhering to recommended goals of 2400 calories and 110 g  protein.  Behavioral Intervention  We discussed the following Behavioral Modification Strategies today: increasing  lean protein intake, decreasing simple carbohydrates , increasing vegetables, increasing lower glycemic fruits, increasing fiber rich foods, avoiding skipping meals, increasing water intake, work on tracking and journaling calories using tracking application, keeping healthy foods at home, continue to practice mindfulness when eating, planning for success, and use the Y[employer] ap .  Additional resources provided today: NA  Recommended Physical Activity Goals  Hodari has been advised to work up to 150 minutes of moderate intensity aerobic activity a week and strengthening exercises 2-3 times per week for cardiovascular health, weight loss maintenance and preservation of muscle mass.   He has agreed to Work on scheduling and tracking physical activity.   Pharmacotherapy changes for the treatment of obesity: none  ASSOCIATED CONDITIONS ADDRESSED TODAY  Essential hypertension Assessment & Plan: BP well controlled Has continued Lisinopril 20 mg once daily without complaints of orthostasis  Continue current meds    Morbid obesity (HCC) with starting BMI 37  BMI 25.0-25.9,adult  Bradycardia Assessment & Plan: Stable He continues to have a low resting heartrate without use of rate lowering medications Denies DOE, syncope or heart palpitations He is scheduled for upcoming visit with cardiology for further eval and treatment Monitoring resting and exertional HR on smartwatch  Continue to monitor HR on smartwatch Keep upcoming cardiology visit   Vitamin D deficiency Assessment & Plan: Last vitamin D Lab Results  Component Value Date   VD25OH  18.5 (L) 11/24/2021   His PCP refilled his vitamin D RX weekly though his most recent level was 51. He has not been taking it  For maintenance of vitamin D, recommend 2,000 international units  daily with plans tor recheck in Jan       He was informed of the importance of frequent follow up visits to maximize his success with intensive  lifestyle modifications for his multiple health conditions.   ATTESTASTION STATEMENTS:  Reviewed by clinician on day of visit: allergies, medications, problem list, medical history, surgical history, family history, social history, and previous encounter notes pertinent to obesity diagnosis.   I have personally spent 30 minutes total time today in preparation, patient care, nutritional counseling and documentation for this visit, including the following: review of clinical lab tests; review of medical tests/procedures/services.      [name], DO DABFM, DABOM [facility] [address] [place], [place] [address] [phone]

## 2023-01-25 NOTE — Assessment & Plan Note (Signed)
BP well controlled Has continued Lisinopril 20 mg once daily without complaints of orthostasis  Continue current meds

## 2023-01-25 NOTE — Assessment & Plan Note (Signed)
Stable He continues to have a low resting heartrate without use of rate lowering medications Denies DOE, syncope or heart palpitations He is scheduled for upcoming visit with cardiology for further eval and treatment Monitoring resting and exertional HR on smartwatch  Continue to monitor HR on smartwatch Keep upcoming cardiology visit

## 2023-01-29 ENCOUNTER — Other Ambulatory Visit (HOSPITAL_BASED_OUTPATIENT_CLINIC_OR_DEPARTMENT_OTHER): Payer: Self-pay

## 2023-02-08 ENCOUNTER — Encounter (HOSPITAL_BASED_OUTPATIENT_CLINIC_OR_DEPARTMENT_OTHER): Payer: 59 | Admitting: Nurse Practitioner

## 2023-02-17 ENCOUNTER — Telehealth: Payer: Self-pay | Admitting: *Deleted

## 2023-02-17 NOTE — Telephone Encounter (Signed)
LMOVM to verify card hx.

## 2023-02-22 ENCOUNTER — Encounter: Payer: Self-pay | Admitting: Cardiology

## 2023-02-22 ENCOUNTER — Ambulatory Visit: Payer: Commercial Managed Care - PPO

## 2023-02-22 ENCOUNTER — Ambulatory Visit: Payer: Commercial Managed Care - PPO | Attending: Cardiology | Admitting: Cardiology

## 2023-02-22 VITALS — BP 134/72 | HR 51 | Ht 70.0 in | Wt 181.8 lb

## 2023-02-22 DIAGNOSIS — R001 Bradycardia, unspecified: Secondary | ICD-10-CM

## 2023-02-22 DIAGNOSIS — I10 Essential (primary) hypertension: Secondary | ICD-10-CM | POA: Diagnosis not present

## 2023-02-22 NOTE — Progress Notes (Signed)
Cardiology Office Note:    Date:  02/22/2023   ID:  Jay Ortiz, DOB December 26, 1985, MRN [id number]  PCP:  Tollie Eth, NP   [facility] Providers Cardiologist:  Debbe Odea, MD     Referring MD: [name], DO   Chief Complaint  Patient presents with   New Patient (Initial Visit)    Referred for cardiac evaluation of bradycardia with no cardiac history.     Jay Ortiz is a 37 y.o. male who is being seen today for the evaluation of bradycardia at the request of Bowen, Scot Jun, DO.   History of Present Illness:    Jay Ortiz is a 37 y.o. male with a hx of hypertension presenting with bradycardia.  Patient follows up at the weight loss clinic, heart rates noted to be low in the 40s at times.  He denies dizziness, presyncope or syncope.  Denies chest pain or shortness of breath with ordinary exertion.  Denies smoking.  Overall doing well.  Takes lisinopril for BP control.  Denies taking any AV nodal agents.  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   levocetirizine (XYZAL) 5 MG tablet Take 1 tablet (5 mg total) by mouth every evening.   lisinopril (ZESTRIL) 20 MG tablet Take 1 tablet (20 mg total) by mouth daily.   ondansetron (ZOFRAN) 8 MG tablet Take 1 tablet (8 mg total) by mouth every 8 (eight) hours  as needed for nausea or vomiting.     Allergies:   Patient has no known allergies.   Social History   Socioeconomic History   Marital status: Single    Spouse name: Not on file   Number of children: Not on file   Years of education: Not on file   Highest education level: Not on file  Occupational History   Not on file  Tobacco Use   Smoking status: Never   Smokeless tobacco: Never  Vaping Use   Vaping status: Never Used  Substance and Sexual Activity   Alcohol use: Yes    Comment: socially   Drug use: No   Sexual activity: Not on file  Other Topics Concern   Not on file  Social History Narrative   Not on file   Social Determinants of Health   Financial Resource Strain: Not on file  Food Insecurity: Not on file  Transportation Needs: Not on file  Physical Activity: Not on file  Stress: Not on file  Social Connections: Not on file     Family History: The patient's family history includes CVA in his maternal grandmother; Colon cancer in his paternal grandmother; Diabetes Mellitus II in his maternal grandfather; Heart attack in his paternal grandfather; Heart disease in his maternal grandfather, maternal grandmother, paternal grandfather, and paternal  grandmother; Hyperlipidemia in his father; Hypertension in his father; Ulcers in his mother.  ROS:   Please see the history of present illness.     All other systems reviewed and are negative.  EKGs/Labs/Other Studies Reviewed:    The following studies were reviewed today:  EKG Interpretation Date/Time:  Tuesday February 22 2023 02:72:53 EDT Ventricular Rate:  51 PR Interval:  178 QRS Duration:  90 QT Interval:  444 QTC Calculation: 409 R Axis:   66  Text Interpretation: Sinus bradycardia Confirmed by Debbe Odea ([id number]) on 02/22/2023 9:31:15 AM    Recent Labs: 07/23/2022: TSH 2.15 01/18/2023: ALT 27; BUN 15; Creatinine, Ser 0.86; Hemoglobin 15.5; Platelets 251; Potassium 4.4; Sodium 141  Recent Lipid  Panel    Component Value Date/Time   CHOL 147 01/18/2023 1611   TRIG 51 01/18/2023 1611   HDL 59 01/18/2023 1611   CHOLHDL 2.5 01/18/2023 1611   LDLCALC 77 01/18/2023 1611     Risk Assessment/Calculations:             Physical Exam:    VS:  BP 134/72 (BP Location: Right Arm, Patient Position: Sitting, Cuff Size: Normal)   Pulse (!) 51   Ht 5\' 10"  (1.778 m)   Wt 181 lb 12.8 oz (82.5 kg)   SpO2 99%   BMI 26.09 kg/m     Wt Readings from Last 3 Encounters:  02/22/23 181 lb 12.8 oz (82.5 kg)  01/25/23 179 lb (81.2 kg)  01/18/23 182 lb 12.8 oz (82.9 kg)     GEN:  Well nourished, well developed in no acute distress HEENT: Normal NECK: No JVD; No carotid bruits CARDIAC: RRR, no murmurs, rubs, gallops RESPIRATORY:  Clear to auscultation without rales, wheezing or rhonchi  ABDOMEN: Soft, non-tender, non-distended MUSCULOSKELETAL:  No edema; No deformity  SKIN: Warm and dry NEUROLOGIC:  Alert and oriented x 3 PSYCHIATRIC:  Normal affect   ASSESSMENT:    1. Bradycardia   2. Primary hypertension    PLAN:    In order of problems listed above:  Sinus bradycardia, patient clinically asymptomatic, heart rate 51.  Place cardiac monitor to evaluate any high degree AV block.  Obtain echocardiogram to rule out any structural abnormalities.  If no significant findings noted on cardiac testing, will recommend monitoring.  No indication for pacemaker at this point. Hypertension, BP controlled.  Continue lisinopril 20 mg daily.  Follow-up after cardiac testing     Medication Adjustments/Labs and Tests Ordered: Current medicines are reviewed at length with the patient today.  Concerns regarding medicines are outlined above.  Orders Placed This Encounter  Procedures   LONG TERM MONITOR (3-14 DAYS)   EKG 12-Lead   ECHOCARDIOGRAM COMPLETE   No orders of the defined types were placed in this encounter.   Patient Instructions  Medication Instructions:   Your physician  recommends that you continue on your current medications as directed. Please refer to the Current Medication list given to you today.  *If you need a refill on your cardiac medications before your next appointment, please call your pharmacy*   Lab Work:  None Ordered  If you have labs (blood work) drawn today and your tests are completely normal, you will receive your results only by: MyChart Message (if you have MyChart) OR A paper copy in the mail If you have any lab test that is abnormal or we need to change your treatment, we will call you to review the results.   Testing/Procedures:  Your physician has  requested that you have an echocardiogram. Echocardiography is a painless test that uses sound waves to create images of your heart. It provides your doctor with information about the size and shape of your heart and how well your heart's chambers and valves are working. This procedure takes approximately one hour. There are no restrictions for this procedure. Please do NOT wear cologne, perfume, aftershave, or lotions (deodorant is allowed). Please arrive 15 minutes prior to your appointment time.  2. Your physician has recommended that you wear a Zio monitor.   This monitor is a medical device that records the heart's electrical activity. Doctors most often use these monitors to diagnose arrhythmias. Arrhythmias are problems with the speed or rhythm of the heartbeat. The monitor is a small device applied to your chest. You can wear one while you do your normal daily activities. While wearing this monitor if you have any symptoms to push the button and record what you felt. Once you have worn this monitor for the period of time provider prescribed (Usually 14 days), you will return the monitor device in the postage paid box. Once it is returned they will download the data collected and provide [place] with a report which the provider will then review and we will call you with those results.  Important tips:  Avoid showering during the first 24 hours of wearing the monitor. Avoid excessive sweating to help maximize wear time. Do not submerge the device, no hot tubs, and no swimming pools. Keep any lotions or oils away from the patch. After 24 hours you may shower with the patch on. Take brief showers with your back facing the shower head.  Do not remove patch once it has been placed because that will interrupt data and decrease adhesive wear time. Push the button when you have any symptoms and write down what you were feeling. Once you have completed wearing your monitor, remove and place into box which has postage paid and place in your outgoing mailbox.  If for some reason you have misplaced your box then call our office and we can provide another box and/or mail it off for you.     Follow-Up: At [facility], you and your health needs are our priority.  As part of our continuing mission to provide you with exceptional heart care, we have created designated Provider Care Teams.  These Care Teams include your primary Cardiologist (physician) and Advanced Practice Providers (APPs -  Physician Assistants and Nurse Practitioners) who all work together to provide you with the care you need, when you need it.  We recommend signing up for the patient portal called "MyChart".  Sign up information is provided on this After Visit Summary.  MyChart is used to connect with patients for Virtual Visits (Telemedicine).  Patients are able to view lab/test results, encounter notes, upcoming appointments, etc.  Non-urgent messages can be sent to your provider as well.   To learn more about what you can do with MyChart, go to [email or web address].    Your next appointment:    After Cardiac Testing   Provider:   You may see Debbe Odea, MD or one of the following Advanced Practice Providers on your designated Care Team:   Nicolasa Ducking, NP Eula Listen, PA-C Cadence Fransico Michael,  PA-C Charlsie Quest, NP    Signed, Debbe Odea, MD  02/22/2023 9:58 AM    [facility]

## 2023-02-22 NOTE — Patient Instructions (Signed)
Medication Instructions:   Your physician recommends that you continue on your current medications as directed. Please refer to the Current Medication list given to you today.  *If you need a refill on your cardiac medications before your next appointment, please call your pharmacy*   Lab Work:  None Ordered  If you have labs (blood work) drawn today and your tests are completely normal, you will receive your results only by: MyChart Message (if you have MyChart) OR A paper copy in the mail If you have any lab test that is abnormal or we need to change your treatment, we will call you to review the results.   Testing/Procedures:  Your physician has requested that you have an echocardiogram. Echocardiography is a painless test that uses sound waves to create images of your heart. It provides your doctor with information about the size and shape of your heart and how well your heart's chambers and valves are working. This procedure takes approximately one hour. There are no restrictions for this procedure. Please do NOT wear cologne, perfume, aftershave, or lotions (deodorant is allowed). Please arrive 15 minutes prior to your appointment time.  2. Your physician has recommended that you wear a Zio monitor.   This monitor is a medical device that records the heart's electrical activity. Doctors most often use these monitors to diagnose arrhythmias. Arrhythmias are problems with the speed or rhythm of the heartbeat. The monitor is a small device applied to your chest. You can wear one while you do your normal daily activities. While wearing this monitor if you have any symptoms to push the button and record what you felt. Once you have worn this monitor for the period of time provider prescribed (Usually 14 days), you will return the monitor device in the postage paid box. Once it is returned they will download the data collected and provide [place] with a report which the provider will then review  and we will call you with those results. Important tips:  Avoid showering during the first 24 hours of wearing the monitor. Avoid excessive sweating to help maximize wear time. Do not submerge the device, no hot tubs, and no swimming pools. Keep any lotions or oils away from the patch. After 24 hours you may shower with the patch on. Take brief showers with your back facing the shower head.  Do not remove patch once it has been placed because that will interrupt data and decrease adhesive wear time. Push the button when you have any symptoms and write down what you were feeling. Once you have completed wearing your monitor, remove and place into box which has postage paid and place in your outgoing mailbox.  If for some reason you have misplaced your box then call our office and we can provide another box and/or mail it off for you.     Follow-Up: At [facility], you and your health needs are our priority.  As part of our continuing mission to provide you with exceptional heart care, we have created designated Provider Care Teams.  These Care Teams include your primary Cardiologist (physician) and Advanced Practice Providers (APPs -  Physician Assistants and Nurse Practitioners) who all work together to provide you with the care you need, when you need it.  We recommend signing up for the patient portal called "MyChart".  Sign up information is provided on this After Visit Summary.  MyChart is used to connect with patients for Virtual Visits (Telemedicine).  Patients are able to  view lab/test results, encounter notes, upcoming appointments, etc.  Non-urgent messages can be sent to your provider as well.   To learn more about what you can do with MyChart, go to [email or web address].    Your next appointment:    After Cardiac Testing   Provider:   You may see Debbe Odea, MD or one of the following Advanced Practice Providers on your designated Care Team:   Nicolasa Ducking, NP Eula Listen, PA-C Cadence Fransico Michael, PA-C Charlsie Quest, NP

## 2023-02-26 DIAGNOSIS — R001 Bradycardia, unspecified: Secondary | ICD-10-CM | POA: Diagnosis not present

## 2023-02-27 ENCOUNTER — Encounter: Payer: Self-pay | Admitting: Nurse Practitioner

## 2023-02-27 DIAGNOSIS — F5221 Male erectile disorder: Secondary | ICD-10-CM

## 2023-03-01 ENCOUNTER — Other Ambulatory Visit (HOSPITAL_BASED_OUTPATIENT_CLINIC_OR_DEPARTMENT_OTHER): Payer: Self-pay

## 2023-03-01 DIAGNOSIS — H5203 Hypermetropia, bilateral: Secondary | ICD-10-CM | POA: Diagnosis not present

## 2023-03-01 MED ORDER — TADALAFIL 10 MG PO TABS
10.0000 mg | ORAL_TABLET | ORAL | 1 refills | Status: DC | PRN
Start: 2023-03-01 — End: 2023-04-28
  Filled 2023-03-01: qty 6, 30d supply, fill #0
  Filled 2023-03-30: qty 6, 30d supply, fill #1

## 2023-03-15 ENCOUNTER — Other Ambulatory Visit (HOSPITAL_BASED_OUTPATIENT_CLINIC_OR_DEPARTMENT_OTHER): Payer: Self-pay

## 2023-03-15 ENCOUNTER — Ambulatory Visit: Payer: Commercial Managed Care - PPO | Attending: Cardiology

## 2023-03-15 ENCOUNTER — Other Ambulatory Visit: Payer: Self-pay

## 2023-03-15 DIAGNOSIS — R001 Bradycardia, unspecified: Secondary | ICD-10-CM | POA: Diagnosis not present

## 2023-03-15 LAB — ECHOCARDIOGRAM COMPLETE
Area-P 1/2: 2.95 cm2
S' Lateral: 4.9 cm

## 2023-03-18 DIAGNOSIS — R001 Bradycardia, unspecified: Secondary | ICD-10-CM | POA: Diagnosis not present

## 2023-03-22 ENCOUNTER — Ambulatory Visit: Payer: Commercial Managed Care - PPO | Admitting: Family Medicine

## 2023-03-22 ENCOUNTER — Encounter: Payer: Self-pay | Admitting: Family Medicine

## 2023-03-22 VITALS — BP 117/68 | HR 46 | Temp 98.6°F | Ht 70.0 in | Wt 175.0 lb

## 2023-03-22 DIAGNOSIS — R001 Bradycardia, unspecified: Secondary | ICD-10-CM

## 2023-03-22 DIAGNOSIS — M6281 Muscle weakness (generalized): Secondary | ICD-10-CM

## 2023-03-22 DIAGNOSIS — G47411 Narcolepsy with cataplexy: Secondary | ICD-10-CM

## 2023-03-22 DIAGNOSIS — Z6825 Body mass index (BMI) 25.0-25.9, adult: Secondary | ICD-10-CM

## 2023-03-22 NOTE — Progress Notes (Signed)
Office: [phone]  /  Fax: [phone]  WEIGHT SUMMARY AND BIOMETRICS  Starting Date: 11/24/21  Starting Weight: 256lb   Weight Lost Since Last Visit: 4lb   Vitals Temp: 98.6 F (37 C) BP: 117/68 Pulse Rate: (!) 46 SpO2: 100 %   Body Composition  Body Fat %: 17.7 % Fat Mass (lbs): 31 lbs Muscle Mass (lbs): 137 lbs Total Body Water (lbs): 103.8 lbs Visceral Fat Rating : 5   HPI  Chief Complaint: OBESITY  Jay Ortiz is here to discuss his progress with his obesity treatment plan. He is on the [employer] plan and states he is following his eating plan approximately 75 % of the time. He states he is exercising 45-60 minutes 5 times per week.   Interval History:  Since last office visit he is down 4 lb This gives him a net weight loss of 81 pounds in the past 1+ years of medically supervised weight management This is a 31.6% total body weight loss He has seen 25.6 pounds of muscle loss.  This is 31% of his total body weight loss He is doing weight training 2 x a week and running > 5 miles 3 days/ wk He is tracking his calories and getting 2400 cal/ day He is tracking most days of the week.  He did up his carbs 3 mos ago and energy levels are good He is working on getting water in.   He is getting in weight training with a smith machine at home 2-3 x a week He feels good about his current weight  Pharmacotherapy: None  PHYSICAL EXAM:  Blood pressure 117/68, pulse (!) 46, temperature 98.6 F (37 C), height 5\' 10"  (1.778 m), weight 175 lb (79.4 kg), SpO2 100%. Body mass index is 25.11 kg/m.  General: He is appearing, cooperative, alert, well developed, and in no acute distress. PSYCH: Has normal mood, affect and thought process.   Lungs: Normal breathing effort, no conversational dyspnea.   ASSESSMENT AND PLAN  TREATMENT PLAN FOR OBESITY:  Recommended Dietary Goals  Jay Ortiz is currently in the action stage of change. As such, his goal is to continue  weight management plan. He has agreed to keeping a food journal and adhering to recommended goals of 2500 calories and 110+ grams of protein.  Behavioral Intervention  We discussed the following Behavioral Modification Strategies today: increasing fiber rich foods, work on tracking and journaling calories using tracking application, planning for success, and continue to work on maintaining a reduced calorie state, getting the recommended amount of protein, incorporating whole foods, making healthy choices, staying well hydrated and practicing mindfulness when eating.. -Recommend a healthy carbohydrate snack preworkout like a simple meals bar or a rice cake with peanut butter as opposed to an apple which is harder to digest  Additional resources provided today: NA  Recommended Physical Activity Goals  Jay Ortiz has been advised to work up to 150 minutes of moderate intensity aerobic activity a week and strengthening exercises 2-3 times per week for cardiovascular health, weight loss maintenance and preservation of muscle mass.   He has agreed to Work on scheduling and tracking physical activity.  -Reduce weight training to 2 days a week allowing for 2 rest days each week  Pharmacotherapy changes for the treatment of obesity: None  ASSOCIATED CONDITIONS ADDRESSED TODAY  Bradycardia Assessment & Plan: He was referred to cardiology and seen on 02/22/2023, notes reviewed Cardiac monitor was uneventful Echocardiogram showed a left ventricular ejection fraction of 49% Cardiology follow-up  is scheduled He is asymptomatic with his sinus bradycardia even with running He is not on any rate lowering medications  Keep upcoming visit with cardiology   Morbid obesity (HCC) with starting BMI 37  BMI 25.0-25.9,adult  Transient total loss of muscle tone Assessment & Plan: Of his total body weight loss since starting our program of medically supervised weight management, 31% has been muscle mass.  He  has been tracking his daily intake of protein and aiming for 110 or more grams per day.  He has been more consistent with weight training 2 to 3 days a week.  Goal is less than 30% muscle loss  Continue weight training from home 30 minutes 2 days a week Aim for 110+ grams of protein intake daily and will continue to monitor with body composition testing       He was informed of the importance of frequent follow up visits to maximize his success with intensive lifestyle modifications for his multiple health conditions.   ATTESTASTION STATEMENTS:  Reviewed by clinician on day of visit: allergies, medications, problem list, medical history, surgical history, family history, social history, and previous encounter notes pertinent to obesity diagnosis.   I have personally spent 30 minutes total time today in preparation, patient care, nutritional counseling and documentation for this visit, including the following: review of clinical lab tests; review of medical tests/procedures/services.      [name], DO DABFM, DABOM [facility] [address] [place], [place] [address] [phone]

## 2023-03-22 NOTE — Assessment & Plan Note (Signed)
Of his total body weight loss since starting our program of medically supervised weight management, 31% has been muscle mass.  He has been tracking his daily intake of protein and aiming for 110 or more grams per day.  He has been more consistent with weight training 2 to 3 days a week.  Goal is less than 30% muscle loss  Continue weight training from home 30 minutes 2 days a week Aim for 110+ grams of protein intake daily and will continue to monitor with body composition testing

## 2023-03-22 NOTE — Assessment & Plan Note (Signed)
He was referred to cardiology and seen on 02/22/2023, notes reviewed Cardiac monitor was uneventful Echocardiogram showed a left ventricular ejection fraction of 49% Cardiology follow-up is scheduled He is asymptomatic with his sinus bradycardia even with running He is not on any rate lowering medications  Keep upcoming visit with cardiology

## 2023-03-29 ENCOUNTER — Ambulatory Visit: Payer: Commercial Managed Care - PPO | Attending: Cardiology | Admitting: Cardiology

## 2023-03-29 ENCOUNTER — Encounter: Payer: Self-pay | Admitting: Cardiology

## 2023-03-29 VITALS — BP 122/66 | HR 52 | Ht 70.0 in | Wt 180.2 lb

## 2023-03-29 DIAGNOSIS — I10 Essential (primary) hypertension: Secondary | ICD-10-CM

## 2023-03-29 DIAGNOSIS — R001 Bradycardia, unspecified: Secondary | ICD-10-CM

## 2023-03-29 NOTE — Patient Instructions (Signed)
Medication Instructions:  - No changes *If you need a refill on your cardiac medications before your next appointment, please call your pharmacy*  Lab Work: - None ordered  Testing/Procedures: - None ordered  Follow-Up: At [facility], you and your health needs are our priority.  As part of our continuing mission to provide you with exceptional heart care, we have created designated Provider Care Teams.  These Care Teams include your primary Cardiologist (physician) and Advanced Practice Providers (APPs -  Physician Assistants and Nurse Practitioners) who all work together to provide you with the care you need, when you need it.  Your next appointment:   Follow-up as needed   Provider:   You may see Debbe Odea, MD or one of the following Advanced Practice Providers on your designated Care Team:   Nicolasa Ducking, NP Eula Listen, PA-C Cadence Fransico Michael, PA-C Charlsie Quest, NP Carlos Levering, NP    Other Instructions - None

## 2023-03-29 NOTE — Progress Notes (Signed)
Cardiology Office Note:  .   Date:  03/29/2023  ID:  Jay Ortiz, DOB 10-16-1985, MRN [id number] PCP: Tollie Eth, NP  [facility] Providers Cardiologist:  Debbe Odea, MD    History of Present Illness: .   Jay Ortiz is a 37 y.o. male with a past medical history of hypertension, bradycardia, anxiety, OSA on CPAP, hyperlipidemia, obesity where he follows with weight loss clinic, who is here today for follow-up.  Patient was previously seen in clinic 02/22/2023 by Dr.Agbor-Etang.  At that time he was referred for cardiac evaluation due to bradycardia.  Heart rates noted to be in the low 40s at times.  He denied any dizziness, presyncope or syncope.  Denied any chest pain or shortness of breath.  Had not been previously prescribed any AV nodal blocking agents.  EKG revealed sinus bradycardia with a rate of 51.  He was to be placed on a cardiac monitor to evaluate for high-grade AV block as well as echocardiogram to rule out any structural abnormalities.  He returns to clinic today stating that he has been doing well from the cardiac perspective.  He denies any lightheadedness/dizziness, syncope or near syncopal episodes.  Denies any chest pain or shortness of breath.  Continue to monitor his heart rate with his watch.  He also continues to follow with healthy weight loss.  Has been compliant with his current medication regimen without any adverse events.  Denies any hospitalizations or visits to the emergency department.  ROS: 10 point review of systems is reviewed and considered negative except what is been listed in the HPI  Studies Reviewed: Marland Kitchen       Event Monitor (Zio) 03/18/2023 Patient had a min HR of 36 bpm, max HR of 171 bpm, and avg HR of 56 bpm. Predominant underlying rhythm was Sinus Rhythm. First Degree AV Block was present. Isolated SVEs were rare (<1.0%), and no SVE Couplets or SVE Triplets were present. Isolated VEs  were rare (<1.0%), VE Couplets were rare  (<1.0%), and no VE Triplets were present.   Conclusion Minimal heart rate 36, max heart rate 171, average heart rate 56. No evidence for high degree AV block. No sinus pauses. Overall benign cardiac monitor.  2D echo 03/15/2023 1. Left ventricular ejection fraction, by estimation, is 50 to 55%. Left  ventricular ejection fraction by 3D volume is 49 %. The left ventricle has  low normal function. The left ventricle has no regional wall motion  abnormalities. The left ventricular  internal cavity size was mildly dilated. Left ventricular diastolic  parameters are indeterminate. The average left ventricular global  longitudinal strain is -15.9 %.   2. Right ventricular systolic function is normal. The right ventricular  size is normal. Tricuspid regurgitation signal is inadequate for assessing  PA pressure.   3. The mitral valve is normal in structure. Mild mitral valve  regurgitation. No evidence of mitral stenosis.   4. The aortic valve is normal in structure. Aortic valve regurgitation is  not visualized. No aortic stenosis is present.   5. The inferior vena cava is normal in size with greater than 50%  respiratory variability, suggesting right atrial pressure of 3 mmHg.   Risk Assessment/Calculations:             Physical Exam:   VS:  BP 122/66 (BP Location: Left Arm, Patient Position: Sitting, Cuff Size: Normal)   Pulse (!) 52   Ht 5\' 10"  (1.778 m)   Wt 180 lb  3.2 oz (81.7 kg)   SpO2 98%   BMI 25.86 kg/m    Wt Readings from Last 3 Encounters:  03/29/23 180 lb 3.2 oz (81.7 kg)  03/22/23 175 lb (79.4 kg)  02/22/23 181 lb 12.8 oz (82.5 kg)    GEN: Well nourished, well developed in no acute distress NECK: No JVD; No carotid bruits CARDIAC: RRR, bradycardiac, no murmurs, rubs, gallops RESPIRATORY:  Clear to auscultation without rales, wheezing or rhonchi  ABDOMEN: Soft, non-tender, non-distended EXTREMITIES:  No edema; No deformity   ASSESSMENT AND PLAN: .   Sinus  bradycardia with patient continues to remain asymptomatic.  Recent echocardiogram completed revealed an LVEF of 50-55%, no regional wall motion abnormalities, and mild mitral valve regurgitation.  No structural abnormalities were noted.  Telemetry monitoring revealed an average heart rate of 56 bpm underlying rhythm was sinus rhythm with first-degree AV block.  Overall benign cardiac monitor.  No current indication for internal loop recorder or permanent pacemaker placement.  Patient to continue to monitor heart rate on heart monitoring device such as his watch.  He is to notify us of any issues with lightheadedness/dizziness, syncope or near syncopal episodes.  Primary hypertension with blood pressure today 122/66.  He is continued on 20 mg he is also encouraged to continue to monitor his blood pressures 1 to 2 hours postmedication titration.       Dispo: Patient return to clinic to see MD/APP on an as-needed basis  Signed, Loudon Krakow, NP

## 2023-04-27 ENCOUNTER — Encounter: Payer: Self-pay | Admitting: Nurse Practitioner

## 2023-04-27 DIAGNOSIS — F5221 Male erectile disorder: Secondary | ICD-10-CM

## 2023-04-28 ENCOUNTER — Other Ambulatory Visit (HOSPITAL_BASED_OUTPATIENT_CLINIC_OR_DEPARTMENT_OTHER): Payer: Self-pay

## 2023-04-28 MED ORDER — TADALAFIL 10 MG PO TABS
10.0000 mg | ORAL_TABLET | ORAL | 1 refills | Status: DC | PRN
  Filled 2023-04-28: qty 6, 30d supply, fill #0
  Filled 2023-06-01: qty 6, 30d supply, fill #1
  Filled 2023-06-27 (×2): qty 6, 30d supply, fill #2

## 2023-05-16 ENCOUNTER — Other Ambulatory Visit: Payer: Self-pay | Admitting: Primary Care

## 2023-05-16 DIAGNOSIS — J019 Acute sinusitis, unspecified: Secondary | ICD-10-CM

## 2023-05-16 MED ORDER — AMOXICILLIN-POT CLAVULANATE 875-125 MG PO TABS: 1.0000 | ORAL_TABLET | Freq: Two times a day (BID) | ORAL | 0 refills | Status: DC

## 2023-05-16 MED ORDER — PREDNISONE 20 MG PO TABS: ORAL_TABLET | ORAL | 0 refills | Status: DC

## 2023-05-24 ENCOUNTER — Encounter: Payer: Self-pay | Admitting: Family Medicine

## 2023-05-24 ENCOUNTER — Ambulatory Visit: Payer: Commercial Managed Care - PPO | Admitting: Family Medicine

## 2023-05-24 VITALS — BP 117/75 | HR 52 | Ht 70.0 in | Wt 174.0 lb

## 2023-05-24 DIAGNOSIS — Z6824 Body mass index (BMI) 24.0-24.9, adult: Secondary | ICD-10-CM

## 2023-05-24 DIAGNOSIS — R001 Bradycardia, unspecified: Secondary | ICD-10-CM

## 2023-05-24 NOTE — Assessment & Plan Note (Signed)
He has done very well maintaining a healthy weight  He is doing some dietary logging, regular exercise  Despite holidays and illness, he has been able to maitaince  Recommend weighing 1-2 x a week to help with maintenance

## 2023-05-24 NOTE — Assessment & Plan Note (Signed)
Stable He met with cardiology since last visit, notes reviewed from 11/26 Only findings of sinus brady with wearing Zio Patch and had a normal echocardiogram He denies presyncope   No further workup needed

## 2023-05-24 NOTE — Progress Notes (Signed)
Office: [phone]  /  Fax: [phone]  WEIGHT SUMMARY AND BIOMETRICS  Starting Date: 11/24/21  Starting Weight: 256lb   Weight Lost Since Last Visit: 1lb   Vitals BP: 117/75 Pulse Rate: (!) 52 SpO2: 100 %   Body Composition  Body Fat %: 17.6 % Fat Mass (lbs): 30.8 lbs Muscle Mass (lbs): 136.8 lbs Total Body Water (lbs): 98.6 lbs Visceral Fat Rating : 5    HPI  Chief Complaint: OBESITY  Jay Ortiz is here to discuss his progress with his obesity treatment plan. He is on the the Category 3 Plan and states he is following his eating plan approximately 70 % of the time. He states he is exercising 60 minutes 2-3 times per week.  Interval History:  Since last office visit he is down 1 lb He has a net weight loss of 82 lb in the past 1.5 years of medically supervised weight management This is a 32% TBW loss without use of AOMs He has been sick since last visit and exercise has been limited He plans to get back to running and weight training He is just finishing a week of Augmentin and Prednisone He is happy to maintain his weight He has seen cardiology since last visit due to sinus bradycardia  Pharmacotherapy: none  PHYSICAL EXAM:  Blood pressure 117/75, pulse (!) 52, height 5\' 10"  (1.778 m), weight 174 lb (78.9 kg), SpO2 100%. Body mass index is 24.97 kg/m.  General: He is healthy appearing,  cooperative, alert, well developed, and in no acute distress. PSYCH: Has normal mood, affect and thought process.   Lungs: Normal breathing effort, no conversational dyspnea.  ASSESSMENT AND PLAN  TREATMENT PLAN FOR OBESITY:  Recommended Dietary Goals  Jay Ortiz is currently in the action stage of change. As such, his goal is to continue weight management plan. He has agreed to practicing portion control and making smarter food choices, such as increasing vegetables and decreasing simple carbohydrates.  Behavioral Intervention  We discussed the following Behavioral  Modification Strategies today: increasing lean protein intake to established goals, increasing vegetables, increasing lower glycemic fruits, increasing water intake , work on meal planning and preparation, keeping healthy foods at home, practice mindfulness eating and understand the difference between hunger signals and cravings, planning for success, and continue to work on maintaining a reduced calorie state, getting the recommended amount of protein, incorporating whole foods, making healthy choices, staying well hydrated and practicing mindfulness when eating..  Additional resources provided today: NA  Recommended Physical Activity Goals  Jay Ortiz has been advised to work up to 150 minutes of moderate intensity aerobic activity a week and strengthening exercises 2-3 times per week for cardiovascular health, weight loss maintenance and preservation of muscle mass.   He has agreed to Increase the intensity, frequency or duration of strengthening exercises  and Increase the intensity, frequency or duration of aerobic exercises   Discussed signing up for some local road races  Pharmacotherapy changes for the treatment of obesity: none   ASSOCIATED CONDITIONS ADDRESSED TODAY  Bradycardia Assessment & Plan: Stable He met with cardiology since last visit, notes reviewed from 11/26 Only findings of sinus brady with wearing Zio Patch and had a normal echocardiogram He denies presyncope   No further workup needed     Morbid obesity (HCC) with starting BMI 37 Assessment & Plan: He has done very well maintaining a healthy weight  He is doing some dietary logging, regular exercise  Despite holidays and illness, he has been able  to maitaince  Recommend weighing 1-2 x a week to help with maintenance   BMI 24.0-24.9, adult      He was informed of the importance of frequent follow up visits to maximize his success with intensive lifestyle modifications for his multiple health  conditions.   ATTESTASTION STATEMENTS:  Reviewed by clinician on day of visit: allergies, medications, problem list, medical history, surgical history, family history, social history, and previous encounter notes pertinent to obesity diagnosis.   I have personally spent 30 minutes total time today in preparation, patient care, nutritional counseling and documentation for this visit, including the following: review of clinical lab tests; review of medical tests/procedures/services.      [name], DO DABFM, DABOM [facility] [address] [place], [place] [address] [phone]

## 2023-06-20 ENCOUNTER — Other Ambulatory Visit (HOSPITAL_BASED_OUTPATIENT_CLINIC_OR_DEPARTMENT_OTHER): Payer: Self-pay

## 2023-06-27 ENCOUNTER — Other Ambulatory Visit (HOSPITAL_BASED_OUTPATIENT_CLINIC_OR_DEPARTMENT_OTHER): Payer: Self-pay

## 2023-06-27 ENCOUNTER — Other Ambulatory Visit: Payer: Self-pay

## 2023-06-27 ENCOUNTER — Other Ambulatory Visit: Payer: Self-pay | Admitting: Nurse Practitioner

## 2023-06-27 DIAGNOSIS — I10 Essential (primary) hypertension: Secondary | ICD-10-CM

## 2023-06-27 MED ORDER — LISINOPRIL 20 MG PO TABS
20.0000 mg | ORAL_TABLET | Freq: Every day | ORAL | 1 refills | Status: DC
Start: 2023-06-27 — End: 2023-12-30
  Filled 2023-06-27: qty 90, 90d supply, fill #0
  Filled 2023-09-26: qty 90, 90d supply, fill #1

## 2023-07-07 ENCOUNTER — Ambulatory Visit: Payer: Commercial Managed Care - PPO | Admitting: Nurse Practitioner

## 2023-07-12 ENCOUNTER — Other Ambulatory Visit (HOSPITAL_BASED_OUTPATIENT_CLINIC_OR_DEPARTMENT_OTHER): Payer: Self-pay

## 2023-07-12 ENCOUNTER — Ambulatory Visit: Admitting: Nurse Practitioner

## 2023-07-12 ENCOUNTER — Encounter: Payer: Self-pay | Admitting: Nurse Practitioner

## 2023-07-12 VITALS — BP 132/80 | HR 52 | Ht 70.0 in | Wt 185.8 lb

## 2023-07-12 DIAGNOSIS — F5221 Male erectile disorder: Secondary | ICD-10-CM

## 2023-07-12 DIAGNOSIS — Z23 Encounter for immunization: Secondary | ICD-10-CM

## 2023-07-12 DIAGNOSIS — F411 Generalized anxiety disorder: Secondary | ICD-10-CM | POA: Diagnosis not present

## 2023-07-12 DIAGNOSIS — R7989 Other specified abnormal findings of blood chemistry: Secondary | ICD-10-CM

## 2023-07-12 DIAGNOSIS — N528 Other male erectile dysfunction: Secondary | ICD-10-CM

## 2023-07-12 MED ORDER — TADALAFIL 10 MG PO TABS
10.0000 mg | ORAL_TABLET | ORAL | 5 refills | Status: DC | PRN
  Filled 2023-07-12: qty 10, 20d supply, fill #0
  Filled 2023-07-27: qty 6, 30d supply, fill #0
  Filled 2023-08-23: qty 6, 30d supply, fill #1
  Filled 2023-09-22: qty 6, 30d supply, fill #2
  Filled 2023-10-20: qty 6, 30d supply, fill #3
  Filled 2023-11-21: qty 6, 30d supply, fill #4
  Filled 2023-12-30: qty 6, 30d supply, fill #5
  Filled 2024-02-18 (×2): qty 6, 30d supply, fill #6
  Filled 2024-03-23: qty 6, 30d supply, fill #7
  Filled 2024-04-11 – 2024-04-16 (×2): qty 6, 30d supply, fill #8
  Filled 2024-05-10: qty 6, 30d supply, fill #9

## 2023-07-12 MED ORDER — BUSPIRONE HCL 5 MG PO TABS
5.0000 mg | ORAL_TABLET | Freq: Three times a day (TID) | ORAL | 3 refills | Status: DC
  Filled 2023-07-12: qty 90, 30d supply, fill #0
  Filled 2023-08-06: qty 90, 30d supply, fill #1

## 2023-07-12 NOTE — Assessment & Plan Note (Signed)
 Reports increased generalized anxiety. Hydroxyzine has been effective but causes daytime sedation and hangover effect. Considering switching medications to a daily regimen to aid in more appropriate management. Discussed bupropion and buspirone. We discussed bupropion may exacerbate anxiety due to stimulating effects, while buspirone is preferred for anxiety management. We discussed that adding bupropion as a secondary medication may be helpful in the future if depressive symptoms are found to be present or sexual side effects occur with buspirone. Discussed starting buspirone with flexible dosing to manage anxiety symptoms effectively. We discussed the option for counseling, but he does not feel that he is ready for that at that time. We can revisit this in the future when he is ready.  - Prescribe buspirone 5 mg BID with option to increase to TID if needed - Instruct to monitor symptoms and communicate via MyChart about medication's effectiveness

## 2023-07-12 NOTE — Assessment & Plan Note (Signed)
 Experiences erectile dysfunction attributed to psychological factors, as he has nocturnal and morning erections. The issue seems related to anxiety and performance pressure. Reducing anxiety might alleviate erectile dysfunction symptoms. We will monitor labs today to ensure that there are no hormonal factors that could be contributing.  - Address anxiety as primary approach to managing erectile dysfunction

## 2023-07-12 NOTE — Progress Notes (Signed)
 Jay Eth, DNP, AGNP-c [facility] [address] [place], [place] [address] [phone]   ACUTE VISIT- ESTABLISHED PATIENT  Blood pressure 132/80, pulse (!) 52, height 5\' 10"  (1.778 m), weight 185 lb 12.8 oz (84.3 kg), SpO2 97%.  Subjective:  HPI Jay Ortiz is a 38 y.o. male presents to day for evaluation of acute concern(s).   History of Present Illness Jay Ortiz presents with increased anxiety and erectile dysfunction.  He experiences increased anxiety, which is more prevalent than usual. Hydroxyzine has been used and effective for symptoms intermittently, but its sedative effects make daytime use difficult, and it causes a 'hungover' feeling when taken at night. Anxiety is more noticeable when he is not occupied, such as outside of work or exercise. He continues to exercise regularly, running three times a week, which he finds helpful for managing anxiety. He feels that a daily medication may be needed at this time for management.   He has erectile dysfunction, attributing it to psychological factors. He has nocturnal and morning erections but has had past experiences affecting his confidence levels and subsequent ability to obtain and maintain and erection. Alcohol helps alleviate performance-related anxiety, but he does not want to rely on it. Weight loss has improved his condition, suggesting hormonal factors may have been involved previously. He has been using tadalafil as needed and this is effective. He is hoping that control of underlying performance and generalized anxiety will improve this concern. He is in a fairly recent relationship and reports he is happy.  ROS negative except for what is listed in HPI. History, Medications, Surgery, SDOH, and Family History reviewed and updated as appropriate.  Objective:  Physical Exam Vitals and nursing note reviewed.  Constitutional:      General: He is not in acute distress.    Appearance: Normal appearance. He  is normal weight. He is not ill-appearing.  HENT:     Head: Normocephalic.  Eyes:     Conjunctiva/sclera: Conjunctivae normal.  Musculoskeletal:        General: Normal range of motion.  Neurological:     General: No focal deficit present.     Mental Status: He is alert and oriented to person, place, and time.  Psychiatric:        Mood and Affect: Mood normal.        Behavior: Behavior normal.        Thought Content: Thought content normal.        Judgment: Judgment normal.         Assessment & Plan:   Problem List Items Addressed This Visit     Generalized anxiety disorder - Primary   Reports increased generalized anxiety. Hydroxyzine has been effective but causes daytime sedation and hangover effect. Considering switching medications to a daily regimen to aid in more appropriate management. Discussed bupropion and buspirone. We discussed bupropion may exacerbate anxiety due to stimulating effects, while buspirone is preferred for anxiety management. We discussed that adding bupropion as a secondary medication may be helpful in the future if depressive symptoms are found to be present or sexual side effects occur with buspirone. Discussed starting buspirone with flexible dosing to manage anxiety symptoms effectively. We discussed the option for counseling, but he does not feel that he is ready for that at that time. We can revisit this in the future when he is ready.  - Prescribe buspirone 5 mg BID with option to increase to TID if needed - Instruct to monitor symptoms and communicate  via MyChart about medication's effectiveness      Relevant Medications   busPIRone (BUSPAR) 5 MG tablet   Other Relevant Orders   Testosterone, Free, Total, SHBG   Situational mild acquired erectile disorder   Experiences erectile dysfunction attributed to psychological factors, as he has nocturnal and morning erections. The issue seems related to anxiety and performance pressure. Reducing anxiety  might alleviate erectile dysfunction symptoms. We will monitor labs today to ensure that there are no hormonal factors that could be contributing.  - Address anxiety as primary approach to managing erectile dysfunction      Relevant Medications   tadalafil (CIALIS) 10 MG tablet   Other Visit Diagnoses       Need for HPV vaccination       Relevant Orders   HPV 9-valent vaccine,Recombinat (Completed)     Other male erectile dysfunction       Relevant Orders   Testosterone, Free, Total, SHBG   Estradiol   Prolactin   PSA Total (Reflex To Free)         Jay Eth, DNP, AGNP-c

## 2023-07-13 ENCOUNTER — Other Ambulatory Visit (HOSPITAL_BASED_OUTPATIENT_CLINIC_OR_DEPARTMENT_OTHER): Payer: Self-pay

## 2023-07-13 DIAGNOSIS — N528 Other male erectile dysfunction: Secondary | ICD-10-CM | POA: Diagnosis not present

## 2023-07-13 DIAGNOSIS — F411 Generalized anxiety disorder: Secondary | ICD-10-CM | POA: Diagnosis not present

## 2023-07-19 LAB — PSA TOTAL (REFLEX TO FREE): Prostate Specific Ag, Serum: 0.4 ng/mL (ref 0.0–4.0)

## 2023-07-19 LAB — PROLACTIN: Prolactin: 10.8 ng/mL (ref 3.9–22.7)

## 2023-07-19 LAB — ESTRADIOL: Estradiol: 23.4 pg/mL (ref 7.6–42.6)

## 2023-07-19 LAB — TESTOSTERONE, FREE, TOTAL, SHBG
Sex Hormone Binding: 25.4 nmol/L (ref 16.5–55.9)
Testosterone, Free: 9.9 pg/mL (ref 8.7–25.1)
Testosterone: 366 ng/dL (ref 264–916)

## 2023-07-22 ENCOUNTER — Encounter: Payer: Self-pay | Admitting: Nurse Practitioner

## 2023-07-22 DIAGNOSIS — F411 Generalized anxiety disorder: Secondary | ICD-10-CM

## 2023-07-22 NOTE — Addendum Note (Signed)
 Addended by: Brin Ruggerio, Huntley Dec E on: 07/22/2023 07:32 AM   Modules accepted: Orders

## 2023-07-26 ENCOUNTER — Encounter: Payer: Self-pay | Admitting: Family Medicine

## 2023-07-26 ENCOUNTER — Ambulatory Visit: Payer: Commercial Managed Care - PPO | Admitting: Family Medicine

## 2023-07-26 VITALS — BP 126/70 | HR 56 | Ht 70.0 in | Wt 182.0 lb

## 2023-07-26 DIAGNOSIS — F411 Generalized anxiety disorder: Secondary | ICD-10-CM | POA: Diagnosis not present

## 2023-07-26 DIAGNOSIS — Z6826 Body mass index (BMI) 26.0-26.9, adult: Secondary | ICD-10-CM

## 2023-07-26 DIAGNOSIS — N528 Other male erectile dysfunction: Secondary | ICD-10-CM | POA: Diagnosis not present

## 2023-07-26 DIAGNOSIS — R7989 Other specified abnormal findings of blood chemistry: Secondary | ICD-10-CM | POA: Diagnosis not present

## 2023-07-26 NOTE — Progress Notes (Signed)
 Office: [phone]  /  Fax: [phone]  WEIGHT SUMMARY AND BIOMETRICS  Starting Date: 11/24/21  Starting Weight: 256lb   Weight Lost Since Last Visit: 0lb   Vitals BP: 126/70 Pulse Rate: (!) 56 SpO2: 100 %   Body Composition  Body Fat %: 19.5 % Fat Mass (lbs): 35.6 lbs Muscle Mass (lbs): 139.8 lbs Total Body Water (lbs): 103.2 lbs Visceral Fat Rating : 6    HPI  Chief Complaint: OBESITY  Jay Ortiz is here to discuss his progress with his obesity treatment plan. He is on the the Category 3 Plan and states he is following his eating plan approximately 50 % of the time. He states he is exercising 45 minutes 3 times per week.  Interval History:  Since last office visit he is up 8 lb He is up 3 lb of muscle mass and up 4.8 lb of body fat in 2 mos This gives him a net weight loss of 74 lb in 1.5 years of medically supervised weight management He has been averaging 2500 cal/ day, logging 5 days/ wk He has been in a new relationship the past few months and is happy but has also been eating out more on weekends, indulging in ETOH and bigger portions He is running 5 miles 3 x a week but has not been weight training He has had more anxiety lately, now on Buspar prn He is having his borderline low testosterone rechecked by PCP  Pharmacotherapy: none  PHYSICAL EXAM:  Blood pressure 126/70, pulse (!) 56, height 5\' 10"  (1.778 m), weight 182 lb (82.6 kg), SpO2 100%. Body mass index is 26.11 kg/m.  General: He is healthy appearing,  cooperative, alert, well developed, and in no acute distress. PSYCH: Has normal mood, affect and thought process.   Lungs: Normal breathing effort, no conversational dyspnea.   ASSESSMENT AND PLAN  TREATMENT PLAN FOR OBESITY:  Recommended Dietary Goals  Jay Ortiz is currently in the action stage of change. As such, his goal is to continue weight management plan. He has agreed to keeping a food journal and adhering to recommended goals of  2200-2400 calories and 120 g of  protein and practicing portion control and making smarter food choices, such as increasing vegetables and decreasing simple carbohydrates.  Behavioral Intervention  We discussed the following Behavioral Modification Strategies today: decreasing simple carbohydrates , increasing vegetables, work on tracking and journaling calories using tracking application, keeping healthy foods at home, work on managing stress, creating time for self-care and relaxation, avoiding temptations and identifying enticing environmental cues, and continue to work on maintaining a reduced calorie state, getting the recommended amount of protein, incorporating whole foods, making healthy choices, staying well hydrated and practicing mindfulness when eating..  Additional resources provided today: NA  Recommended Physical Activity Goals  Jay Ortiz has been advised to work up to 150 minutes of moderate intensity aerobic activity a week and strengthening exercises 2-3 times per week for cardiovascular health, weight loss maintenance and preservation of muscle mass.   He has agreed to [employer] strengthening exercises with a goal of 2-3 sessions a week   Pharmacotherapy changes for the treatment of obesity: none  ASSOCIATED CONDITIONS ADDRESSED TODAY  Low testosterone in male F/u with PCP re: repeat lab drawn today Has run borderline low levels for age Motivation to ramp up workouts has been low  Recommend adding in weight training from home 15 + min 2-3 days/ wk  Morbid obesity (HCC) with starting BMI 37  BMI 26.0-26.9,adult  Generalized anxiety disorder Well managed on Buspar 5 mg tid  Had hangover effect from Hydroxyzine Avoid SSRIs due to sexual SE  Continue to work on regular exercise, good nutrition, stress reduction and high quality sleep at night.     He was informed of the importance of frequent follow up visits to maximize his success with intensive lifestyle modifications  for his multiple health conditions.   ATTESTASTION STATEMENTS:  Reviewed by clinician on day of visit: allergies, medications, problem list, medical history, surgical history, family history, social history, and previous encounter notes pertinent to obesity diagnosis.   I have personally spent 30 minutes total time today in preparation, patient care, nutritional counseling and documentation for this visit, including the following: review of clinical lab tests; review of medical tests/procedures/services.      Jay Brink, DO DABFM, DABOM [facility] [address] [place], [place] [address] [phone]

## 2023-07-26 NOTE — Patient Instructions (Signed)
 Keep daily calorie intake 2200 on non - workout days and 2400 calories on work days  Limit meals out to 2 per week Watch out for extras like alcohol, snacks  Keep total daily protein intake ~ 120 g/ day  F/u with PCP for testosterone levels

## 2023-07-27 ENCOUNTER — Other Ambulatory Visit (HOSPITAL_BASED_OUTPATIENT_CLINIC_OR_DEPARTMENT_OTHER): Payer: Self-pay

## 2023-07-29 LAB — TESTOSTERONE, TOTAL, LC/MS/MS: Testosterone, total: 510.8 ng/dL (ref 264.0–916.0)

## 2023-08-10 ENCOUNTER — Other Ambulatory Visit (HOSPITAL_BASED_OUTPATIENT_CLINIC_OR_DEPARTMENT_OTHER): Payer: Self-pay

## 2023-08-10 MED ORDER — BUSPIRONE HCL 10 MG PO TABS
10.0000 mg | ORAL_TABLET | Freq: Three times a day (TID) | ORAL | 3 refills | Status: DC
  Filled 2023-08-10: qty 270, 90d supply, fill #0
  Filled 2023-11-21: qty 270, 90d supply, fill #1

## 2023-08-13 ENCOUNTER — Other Ambulatory Visit (HOSPITAL_BASED_OUTPATIENT_CLINIC_OR_DEPARTMENT_OTHER): Payer: Self-pay

## 2023-08-16 ENCOUNTER — Other Ambulatory Visit

## 2023-08-16 DIAGNOSIS — Z23 Encounter for immunization: Secondary | ICD-10-CM

## 2023-09-20 ENCOUNTER — Ambulatory Visit: Admitting: Family Medicine

## 2023-09-20 ENCOUNTER — Encounter: Payer: Self-pay | Admitting: Family Medicine

## 2023-09-20 VITALS — BP 122/69 | HR 46 | Ht 70.0 in | Wt 181.0 lb

## 2023-09-20 DIAGNOSIS — I10 Essential (primary) hypertension: Secondary | ICD-10-CM | POA: Diagnosis not present

## 2023-09-20 DIAGNOSIS — R632 Polyphagia: Secondary | ICD-10-CM | POA: Diagnosis not present

## 2023-09-20 DIAGNOSIS — Z6825 Body mass index (BMI) 25.0-25.9, adult: Secondary | ICD-10-CM | POA: Diagnosis not present

## 2023-09-20 NOTE — Progress Notes (Signed)
 Office: [phone]  /  Fax: [phone]  WEIGHT SUMMARY AND BIOMETRICS  Starting Date: 11/24/21  Starting Weight: 256lb   Weight Lost Since Last Visit: 1lb   Vitals BP: 122/69 Pulse Rate: (!) 46 SpO2: 99 %   Body Composition  Body Fat %: 19.3 % Fat Mass (lbs): 35 lbs Muscle Mass (lbs): 139.4 lbs Total Body Water (lbs): 102.4 lbs Visceral Fat Rating : 6    HPI  Chief Complaint: OBESITY  Jay Ortiz is here to discuss his progress with his obesity treatment plan. He is on the the Category 3 Plan and states he is following his eating plan approximately 75 % of the time. He states he is exercising 60+ minutes 3 times per week.  Interval History:  Since last office visit he is down 1 lb He is tracking calories 6-7 days/ wk He is running 3 days/ wk for an hour He feels adequately full with 2200-2500 cal/ day He is working on hydrating well He has reduced eating out to 1 x a week He adds in some Gzero on hot running days He is working on getting adequate sleep  He has been able to maintain his body weight at or near 180 lb for the past 9 mos and is pretty happy here His net weight loss is 75 lb in 1.5 years This is a 29% TBW loss  Pharmacotherapy: none  PHYSICAL EXAM:  Blood pressure 122/69, pulse (!) 46, height 5\' 10"  (1.778 m), weight 181 lb (82.1 kg), SpO2 99%. Body mass index is 25.97 kg/m.  General: He is healthy appearing;  cooperative, alert, well developed, and in no acute distress. PSYCH: Has normal mood, affect and thought process.   Lungs: Normal breathing effort, no conversational dyspnea.  ASSESSMENT AND PLAN  TREATMENT PLAN FOR OBESITY:  Recommended Dietary Goals  Jay Ortiz is currently in the action stage of change. As such, his goal is to continue weight management plan. He has agreed to keeping a food journal and adhering to recommended goals of 2200-2500 calories and 100-120 g of  protein.  Behavioral Intervention  We discussed the following  Behavioral Modification Strategies today: increasing lean protein intake to established goals, increasing fiber rich foods, increasing water intake , work on meal planning and preparation, work on Counselling psychologist calories using tracking application, reading food labels , practice mindfulness eating and understand the difference between hunger signals and cravings, continue to practice mindfulness when eating, planning for success, and continue to work on maintaining a reduced calorie state, getting the recommended amount of protein, incorporating whole foods, making healthy choices, staying well hydrated and practicing mindfulness when eating..  Additional resources provided today: NA  Recommended Physical Activity Goals  [place] has been advised to work up to 150 minutes of moderate intensity aerobic activity a week and strengthening exercises 2-3 times per week for cardiovascular health, weight loss maintenance and preservation of muscle mass.   He has agreed to Work on scheduling and tracking physical activity.   Pharmacotherapy changes for the treatment of obesity: none  ASSOCIATED CONDITIONS ADDRESSED TODAY  Essential hypertension BP is well controlled on Lisinopril  20 mg daily Continue current medication per PCP  Morbid obesity (HCC) with starting BMI 37 Will continue to monitor him q 2-3 mos in maintenance phase of weight loss, down 29% TBW in 1.5 years without use of AOMs  BMI 25.0-25.9,adult  Polyphagia Hunger and cravings fairly well controlled by eating on a schedule, prioritizing lean protein and fiber with meals,  mimimizing sugar intake, increasing water intake and actively working on behavior changes.  He has not needed AOM to aid in appetite control Continue active maintenance phase     He was informed of the importance of frequent follow up visits to maximize his success with intensive lifestyle modifications for his multiple health conditions.   ATTESTASTION  STATEMENTS:  Reviewed by clinician on day of visit: allergies, medications, problem list, medical history, surgical history, family history, social history, and previous encounter notes pertinent to obesity diagnosis.   I have personally spent 24 minutes total time today in preparation, patient care, nutritional counseling and education,  and documentation for this visit, including the following: review of most recent clinical lab tests, reviewing medical assistant documentation, review and interpretation of bioimpedence results.     Micky Albee, D.O. DABFM, DABOM [facility] [address] [place], [place] [address] [phone]

## 2023-11-29 ENCOUNTER — Encounter: Payer: Self-pay | Admitting: Family Medicine

## 2023-11-29 ENCOUNTER — Ambulatory Visit: Admitting: Family Medicine

## 2023-11-29 VITALS — BP 103/65 | HR 47 | Ht 70.0 in | Wt 187.0 lb

## 2023-11-29 DIAGNOSIS — R632 Polyphagia: Secondary | ICD-10-CM

## 2023-11-29 DIAGNOSIS — E663 Overweight: Secondary | ICD-10-CM | POA: Diagnosis not present

## 2023-11-29 DIAGNOSIS — I10 Essential (primary) hypertension: Secondary | ICD-10-CM

## 2023-11-29 DIAGNOSIS — Z6826 Body mass index (BMI) 26.0-26.9, adult: Secondary | ICD-10-CM

## 2023-11-29 NOTE — Progress Notes (Signed)
 Office: [phone]  /  Fax: [phone]  WEIGHT SUMMARY AND BIOMETRICS  Starting Date: 11/24/21  Starting Weight: 256lb   Weight Lost Since Last Visit: 0lb   Vitals BP: 103/65 Pulse Rate: (!) 47 SpO2: 99 %   Body Composition  Body Fat %: 21 % Fat Mass (lbs): 39.2 lbs Muscle Mass (lbs): 140.4 lbs Total Body Water (lbs): 104.4 lbs Visceral Fat Rating : 7    HPI  Chief Complaint: OBESITY  Jay Ortiz is here to discuss his progress with his obesity treatment plan. He is on the the Category 3 Plan and states he is following his eating plan approximately 70 % of the time. He states he is exercising 60 minutes 2-3 times per week.  Interval History:  Since last office visit he is up 6 lb He did gain one pound of lean body mass He is running 3 x a week, 3-5 mile runs He has started tracking calories again after getting a bit off track this summer He can get off track on the weekends with socializing He is good about drinking food to work He is hydrating well thru the day He is getting ~130 g of protein daily He now has a net weight loss of 69 lb in 2 years of medically supervised weight management w/o use of anti obesity medications This is a 26.9% TBW loss  Pharmacotherapy: none  PHYSICAL EXAM:  Blood pressure 103/65, pulse (!) 47, height 5' 10 (1.778 m), weight 187 lb (84.8 kg), SpO2 99%. Body mass index is 26.83 kg/m.  General: He is healthy appearing, cooperative, alert, well developed, and in no acute distress. PSYCH: Has normal mood, affect and thought process.   Lungs: Normal breathing effort, no conversational dyspnea.   ASSESSMENT AND PLAN  TREATMENT PLAN FOR OBESITY:  Recommended Dietary Goals  Jay Ortiz is currently in the action stage of change. As such, his goal is to continue weight management plan. He has agreed to keeping a food journal and adhering to recommended goals of 2200 calories and 130 g of  protein and practicing portion control and  making smarter food choices, such as increasing vegetables and decreasing simple carbohydrates.  Behavioral Intervention  We discussed the following Behavioral Modification Strategies today: increasing lean protein intake to established goals, increasing vegetables, increasing fiber rich foods, increasing water intake , keeping healthy foods at home, practice mindfulness eating and understand the difference between hunger signals and cravings, avoiding temptations and identifying enticing environmental cues, planning for success, and continue to work on maintaining a reduced calorie state, getting the recommended amount of protein, incorporating whole foods, making healthy choices, staying well hydrated and practicing mindfulness when eating..  Additional resources provided today: NA  Recommended Physical Activity Goals  Jay Ortiz has been advised to work up to 150 minutes of moderate intensity aerobic activity a week and strengthening exercises 2-3 times per week for cardiovascular health, weight loss maintenance and preservation of muscle mass.   He has agreed to Work on scheduling and tracking physical activity.   Pharmacotherapy changes for the treatment of obesity:   ASSOCIATED CONDITIONS ADDRESSED TODAY  Polyphagia Worse on the weekends without meal planning/ meal prep and eating out He has been working on more mindful eating and better food choices but he admits to reward eating on the weekends that has been harder to reign in.  Plans to try more active social outings vs meals out with friends We discussed choosing more non starchy veggies when eating out vs starches,  avoiding chips, appetizers, excess ETOH or bread when dining out Track calories on weekends  Overweight (BMI 25.0-29.9) Improved. Goal is to maintaining body weight 180-185 lb  BMI 26.0-26.9,adult  Essential hypertension Well controlled on current meds, managed by PCP     He was informed of the importance of  frequent follow up visits to maximize his success with intensive lifestyle modifications for his multiple health conditions.   ATTESTASTION STATEMENTS:  Reviewed by clinician on day of visit: allergies, medications, problem list, medical history, surgical history, family history, social history, and previous encounter notes pertinent to obesity diagnosis.   I have personally spent 23 minutes total time today in preparation, patient care, nutritional counseling and education,  and documentation for this visit, including the following: review of most recent clinical lab tests,, reviewing medical assistant documentation, review and interpretation of bioimpedence results.     Darice Haddock, D.O. DABFM, DABOM [facility] [address] [place], [place] [address] [phone]

## 2023-11-29 NOTE — Patient Instructions (Signed)
 Continue to work on 2200 cal/ day intake Work on your Saturdays  Continue to practice mindfulness Up your veggie intake on Saturdays  Great job with walking and running!  Great job with meal planning and meal prep!

## 2023-12-30 ENCOUNTER — Other Ambulatory Visit (HOSPITAL_BASED_OUTPATIENT_CLINIC_OR_DEPARTMENT_OTHER): Payer: Self-pay

## 2023-12-30 ENCOUNTER — Other Ambulatory Visit: Payer: Self-pay

## 2023-12-30 ENCOUNTER — Other Ambulatory Visit: Payer: Self-pay | Admitting: Nurse Practitioner

## 2023-12-30 DIAGNOSIS — I10 Essential (primary) hypertension: Secondary | ICD-10-CM

## 2023-12-30 MED ORDER — LISINOPRIL 20 MG PO TABS
20.0000 mg | ORAL_TABLET | Freq: Every day | ORAL | 1 refills | Status: AC
  Filled 2023-12-30: qty 90, 90d supply, fill #0
  Filled 2024-03-25: qty 90, 90d supply, fill #1

## 2024-01-17 ENCOUNTER — Other Ambulatory Visit

## 2024-01-23 NOTE — Progress Notes (Signed)
 Flu shot: Covid: 3rd HPV:    VISIT TYPE: CPE on 01/24/2024 Today's Vitals   01/24/24 1501  BP: 124/80  Pulse: (!) 52  Weight: 191 lb (86.6 kg)  Height: 5' 10 (1.778 m)   Body mass index is 27.41 kg/m. BP 124/80   Pulse (!) 52   Ht 5' 10 (1.778 m)   Wt 191 lb (86.6 kg)   BMI 27.41 kg/m    Subjective:    Patient ID: Jay Ortiz, male    DOB: Mar 01, 1986, 38 y.o.   MRN: [id number]  HPI: History of Present Illness Jay Ortiz is a 38 year old male who presents for annual exam.  He experiences drowsiness with his current medication, Buspar , which affects his daily activities. His anxiety symptoms fluctuate, with periods of feeling 'normal' and other times experiencing a 'heavy' sensation in the chest. He occasionally uses hydroxyzine  for anxiety, approximately three times a year, and has not used Zofran  recently but would like to have it available as needed.  He has a history of orthostatic hypotension, with a recent episode occurring after alcohol consumption and possible dehydration. He experienced lightheadedness upon standing at night but did not sustain any injuries. He monitors his blood pressure at home and reports it as stable. No chest pain, palpitations, or swelling in feet or ankles.  He has a history of sleep apnea and reports that after a repeat sleep study following weight loss, he discontinued CPAP therapy.  He experiences occasional erectile dysfunction, which he attributes to anxiety and past experiences. He describes a history of anxiety related to sexual performance since his [place] twenties. Nocturnal erections still occur, and he has had successful sexual encounters without issues when not anxious.  He has lost weight and reports a current BMI of 26-27, fluctuating between 180 to 190 pounds. He feels healthy at this weight and maintains it through regular exercise, running three times a week for about 60 minutes each session.  He is in a relationship  and has recently moved in with his partner. He has not yet informed his family about his relationship status.  Pertinent items are noted in HPI.  Most Recent Depression Screen:     01/24/2024    2:59 PM 01/18/2023    3:06 PM 02/02/2022    3:10 PM 11/24/2021    8:44 AM 01/27/2021    2:40 PM  Depression screen PHQ 2/9  Decreased Interest 0 0 0 1 0  Down, Depressed, Hopeless 0 0 0 0 0  PHQ - 2 Score 0 0 0 1 0  Altered sleeping   0 1 1  Tired, decreased energy   1 1 0  Change in appetite   1 2 1   Feeling bad or failure about yourself    1 0 0  Trouble concentrating   1 1 0  Moving slowly or fidgety/restless   0 0 0  Suicidal thoughts   0 0 0  PHQ-9 Score   4 6 2   Difficult doing work/chores   Not difficult at all Not difficult at all Not difficult at all   Most Recent Anxiety Screen:    01/24/2024    2:59 PM 07/12/2023    2:12 PM 02/02/2022    3:11 PM 01/27/2021    2:40 PM  GAD 7 : Generalized Anxiety Score  Nervous, Anxious, on Edge 1 2 0 2  Control/stop worrying 2 1 0 2  Worry too much - different things 1 2 1  1  Trouble relaxing 0 0 0 1  Restless 0 0 0 0  Easily annoyed or irritable 0 0 1 0  Afraid - awful might happen 0 0 0 0  Total GAD 7 Score 4 5 2 6   Anxiety Difficulty Somewhat difficult Not difficult at all Not difficult at all Somewhat difficult   Most Recent Falls Screen:    01/24/2024    2:58 PM 01/18/2023    3:06 PM 08/25/2022   12:41 PM 02/02/2022    3:10 PM 01/27/2021    2:40 PM  Fall Risk   Falls in the past year? 1 0 0 0 0  Number falls in past yr: 0 0 0 0 0  Injury with Fall? 0 0 0 0 0  Risk for fall due to : Other (Comment) No Fall Risks No Fall Risks No Fall Risks No Fall Risks  Follow up Falls evaluation completed Falls evaluation completed Falls evaluation completed Falls evaluation completed;Education provided  Falls evaluation completed      Data saved with a previous flowsheet row definition    Past medical history, surgical history, medications,  allergies, family history and social history reviewed with patient today and changes made to appropriate areas of the chart.  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   lisinopril  (ZESTRIL ) 20 MG tablet Take 1 tablet (20 mg total) by mouth daily.   tadalafil  (CIALIS ) 10 MG tablet Take 1 tablet (10 mg total) by mouth every other day as needed for erectile dysfunction.   No current facility-administered medications on file prior to visit.   Surgical History:  Past Surgical History:  Procedure Laterality Date   TONSILLECTOMY     Allergies:  No Known Allergies Social History:  Social History   Socioeconomic History   Marital status: Single    Spouse name: Not on file   Number of children: Not on file   Years of education: Not on file   Highest education level: Not on file  Occupational History   Not on file  Tobacco Use   Smoking status: Never   Smokeless tobacco: Never  Vaping Use   Vaping status: Never Used  Substance and Sexual Activity   Alcohol use: Yes    Comment: socially   Drug use: No   Sexual activity: Not on file  Other Topics Concern   Not on file  Social History Narrative   Not on file   Social Drivers of Health   Financial Resource Strain: Not on file  Food Insecurity: Not on file  Transportation Needs: Not on file   Physical Activity: Not on file  Stress: Not on file  Social Connections: Not on file  Intimate Partner Violence: Not on file   Social History   Tobacco Use  Smoking Status Never  Smokeless Tobacco Never  Social History   Substance and Sexual Activity  Alcohol Use Yes   Comment: socially   Family History:  Family History  Problem Relation Age of Onset   Ulcers Mother    Hypertension Father    Hyperlipidemia Father    Heart disease Maternal Grandmother    CVA Maternal Grandmother    Heart disease Maternal Grandfather    Diabetes Mellitus II Maternal Grandfather    Heart disease Paternal Grandmother    Colon cancer Paternal Grandmother    Heart disease Paternal Grandfather    Heart attack Paternal Grandfather        Objective:    BP 124/80   Pulse (!) 52   Ht 5' 10 (1.778 m)   Wt 191 lb (86.6 kg)   BMI 27.41 kg/m   Wt Readings from Last 3 Encounters:  01/24/24 191 lb (86.6 kg)  11/29/23 187 lb (84.8 kg)  09/20/23 181 lb (82.1 kg)    Physical Exam Vitals and nursing note reviewed.  Constitutional:      Appearance: Normal appearance. He is normal weight.  HENT:     Head: Normocephalic and atraumatic.     Right Ear: Hearing, tympanic membrane, ear canal and external ear normal.     Left Ear: Hearing, tympanic membrane, ear canal and external ear normal.     Nose: Nose normal.     Right Sinus: No maxillary sinus tenderness or frontal sinus tenderness.     Left Sinus: No maxillary sinus tenderness or frontal sinus tenderness.     Mouth/Throat:     Lips: Pink.     Mouth: Mucous membranes are moist.     Pharynx: Oropharynx is clear.  Eyes:     General: Lids are normal. Vision grossly intact.     Extraocular Movements: Extraocular movements intact.     Pupils: Pupils are equal, round, and reactive to light.     Funduscopic exam:    Right eye: No hemorrhage. Red reflex present.        Left eye: No hemorrhage. Red reflex present.    Visual Fields: Right eye  visual fields normal and left eye visual fields normal.  Neck:     Thyroid : No thyromegaly.     Vascular: No carotid bruit or JVD.  Cardiovascular:     Rate and Rhythm: Normal rate and regular rhythm.     Chest Wall: PMI is not displaced.     Pulses: Normal pulses.          Dorsalis pedis pulses are 2+ on the right side and 2+ on the left side.       Posterior tibial pulses are 2+ on the right side and 2+ on the left side.     Heart sounds: Normal heart sounds. No murmur heard. Pulmonary:     Effort: Pulmonary effort is normal. No respiratory distress.     Breath sounds: Normal breath sounds.  Chest:  Breasts:    Breasts are symmetrical.  Abdominal:     General: Bowel sounds are normal. There is no distension or abdominal bruit.     Palpations: Abdomen is soft. There is no hepatomegaly, splenomegaly or mass.     Tenderness: There is no abdominal tenderness. There is no right CVA tenderness, left CVA tenderness, guarding or rebound.  Musculoskeletal:        General: Normal range of motion.     Cervical back: Full passive range of motion without pain and neck supple. No tenderness. No spinous process  tenderness or muscular tenderness.     Right lower leg: No edema.     Left lower leg: No edema.  Feet:     Right foot:     Toenail Condition: Right toenails are normal.     Left foot:     Toenail Condition: Left toenails are normal.  Lymphadenopathy:     Cervical: No cervical adenopathy.     Upper Body:     Right upper body: No supraclavicular adenopathy.     Left upper body: No supraclavicular adenopathy.  Skin:    General: Skin is warm and dry.     Capillary Refill: Capillary refill takes less than 2 seconds.     Nails: There is no clubbing.  Neurological:     General: No focal deficit present.     Mental Status: He is alert and oriented to person, place, and time.     Cranial Nerves: No cranial nerve deficit.     Sensory: Sensation is intact. No sensory deficit.     Motor:  Motor function is intact. No weakness.     Coordination: Coordination is intact. Coordination normal.     Gait: Gait is intact. Gait normal.  Psychiatric:        Attention and Perception: Attention normal.        Mood and Affect: Mood normal.        Speech: Speech normal.        Behavior: Behavior normal. Behavior is cooperative.        Cognition and Memory: Cognition and memory normal.      Results for orders placed or performed in visit on 01/24/24  CBC with Differential/Platelet   Collection Time: 01/24/24  4:18 PM  Result Value Ref Range   WBC 8.9 3.4 - 10.8 x10E3/uL   RBC 5.13 4.14 - 5.80 x10E6/uL   Hemoglobin 15.7 13.0 - 17.7 g/dL   Hematocrit 51.9 62.4 - 51.0 %   MCV 94 79 - 97 fL   MCH 30.6 26.6 - 33.0 pg   MCHC 32.7 31.5 - 35.7 g/dL   RDW 87.6 88.3 - 84.5 %   Platelets 282 150 - 450 x10E3/uL   Neutrophils 58 Not Estab. %   Lymphs 31 Not Estab. %   Monocytes 7 Not Estab. %   Eos 3 Not Estab. %   Basos 1 Not Estab. %   Neutrophils Absolute 5.2 1.4 - 7.0 x10E3/uL   Lymphocytes Absolute 2.7 0.7 - 3.1 x10E3/uL   Monocytes Absolute 0.6 0.1 - 0.9 x10E3/uL   EOS (ABSOLUTE) 0.3 0.0 - 0.4 x10E3/uL   Basophils Absolute 0.1 0.0 - 0.2 x10E3/uL   Immature Granulocytes 0 Not Estab. %   Immature Grans (Abs) 0.0 0.0 - 0.1 x10E3/uL  Hemoglobin A1c   Collection Time: 01/24/24  4:18 PM  Result Value Ref Range   Hgb A1c MFr Bld 5.2 4.8 - 5.6 %   Est. average glucose Bld gHb Est-mCnc 103 mg/dL  RFE85+ZHQM   Collection Time: 01/24/24  4:18 PM  Result Value Ref Range   Glucose 85 70 - 99 mg/dL   BUN 15 6 - 20 mg/dL   Creatinine, Ser 9.01 0.76 - 1.27 mg/dL   eGFR 896 >40 fO/fpw/8.26   BUN/Creatinine Ratio 15 9 - 20   Sodium 139 134 - 144 mmol/L   Potassium 4.0 3.5 - 5.2 mmol/L   Chloride 100 96 - 106 mmol/L   CO2 23 20 - 29 mmol/L   Calcium 9.7 8.7 - 10.2  mg/dL   Total Protein 7.0 6.0 - 8.5 g/dL   Albumin 4.9 4.1 - 5.1 g/dL   Globulin, Total 2.1 1.5 - 4.5 g/dL   Bilirubin  Total 0.7 0.0 - 1.2 mg/dL   Alkaline Phosphatase 39 (L) 47 - 123 IU/L   AST 23 0 - 40 IU/L   ALT 23 0 - 44 IU/L  Lipid panel   Collection Time: 01/24/24  4:18 PM  Result Value Ref Range   Cholesterol, Total 154 100 - 199 mg/dL   Triglycerides 59 0 - 149 mg/dL   HDL 59 >60 mg/dL   VLDL Cholesterol Cal 12 5 - 40 mg/dL   LDL Chol Calc (NIH) 83 0 - 99 mg/dL   Chol/HDL Ratio 2.6 0.0 - 5.0 ratio      Assessment & Plan:   Problem List Items Addressed This Visit     Encounter for annual physical exam   CPE completed today. Review of HM activities and recommendations discussed and provided on AVS. Anticipatory guidance, diet, and exercise recommendations provided. Medications, allergies, and hx reviewed and updated as necessary. Orders placed as listed below.  Plan: - Labs ordered. Will make changes as necessary based on results.  - I will review these results and send recommendations via MyChart or a telephone call.  - F/U with CPE in 1 year or sooner for acute/chronic health needs as directed.        Generalized anxiety disorder   Generalized anxiety disorder with symptoms of anxiety and chest heaviness. Buspar  has been intermittently effective but causes sleepiness. He is interested in switching to an SNRI, specifically Effexor , for symptom management. Therapy is considered as an adjunct for anxiety and psychosocial issues. - Prescribe Effexor  with a tapering plan from Buspar . - Discuss therapy options for anxiety management. - Monitor for side effects and effectiveness of Effexor .      Relevant Medications   venlafaxine  XR (EFFEXOR  XR) 37.5 MG 24 hr capsule   Other Relevant Orders   CBC with Differential/Platelet (Completed)   Hemoglobin A1c (Completed)   CMP14+EGFR (Completed)   Lipid panel (Completed)   Seasonal allergic rhinitis   Chronic. Currently well controlled with xyzal . Exam benign today with no concerns. Will refill medication and continue to monitor.        Relevant Medications   levocetirizine (XYZAL ) 5 MG tablet   Dyslipidemia   Patient has had significant weight loss with improved diet and exercise regimen. We will monitor labs today to ensure lipids are under control. At this time he is not on medication therapy. We can consider this if his lipids remain elevated.       Essential hypertension   Essential hypertension is well-controlled with current medication regimen. Episode of orthostatic hypotension possibly related to dehydration and rapid position change. - Advise on hydration and gradual position changes to prevent orthostatic hypotension.      History of sleep apnea   No further concerns present since weight loss. Not currently utilizing CPAP. Will continue to monitor closely and make changes as necessary based on symptoms and apneic episodes/snoring.       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 Anxiety exacerbated by anticipatory stress. Reports nocturnal erections and no issues when not anxious. Therapy considered for underlying anxiety and performance concerns. - Discuss therapy options to address performance anxiety. - Continue current management and monitor for changes.      Other Visit Diagnoses  Need for HPV vaccination    -  Primary   Relevant Orders   HPV 9-valent vaccine,Recombinat (Completed)     Nausea and vomiting, unspecified vomiting type       Relevant Medications   ondansetron  (ZOFRAN -ODT) 8 MG disintegrating tablet     Screening for endocrine, nutritional, metabolic and immunity disorder       Relevant Orders   CBC with Differential/Platelet (Completed)   Hemoglobin A1c (Completed)   CMP14+EGFR (Completed)   Lipid panel (Completed)       Follow up plan: NEXT PREVENTATIVE PHYSICAL DUE IN 1 YEAR.   LABORATORY TESTING:  Health maintenance labs ordered today, if applicable.    PATIENT COUNSELING:    For all adult patients, I recommend A well balanced diet low in saturated fats, cholesterol, and moderation in carbohydrates.  This can be as simple as monitoring portion sizes and cutting back on sugary beverages such as soda and juice to start with.    Daily water consumption of at least 64 ounces.  Physical activity at least 180 minutes per week, if just starting out.  This can be as simple as taking the stairs instead of the elevator and walking 2-3 laps around the office  purposefully every day.   STD protection, partner selection, and regular testing if high risk.  Limited consumption of alcoholic beverages if alcohol is consumed. For men, I recommend no more than 14 alcoholic beverages per week, spread out throughout the week (max 2 per day). Avoid binge drinking or consuming large quantities of alcohol in one setting.  Please let me know if you feel you may need help with reduction or quitting alcohol consumption.   Avoidance of nicotine, if used. Please let me know if you feel you may need help with reduction or quitting nicotine use.   Daily mental health attention. This can be in the form of 5 minute daily meditation, prayer, journaling, yoga, reflection, etc.  Purposeful attention to your emotions and mental state can significantly improve your overall wellbeing and Health.  Please know that I am here to help you with all of your health care goals and am happy to work with you to find a solution that works best for you.  The greatest advice I have received with any changes in life are to take it one step at a time, that even means if all you can focus on is the next 60 seconds, then do that and celebrate your victories.  With any changes in life, you will have set backs, and that is OK. The important thing to remember is, if you have a set back, it is not a failure, it is an opportunity to try again!  Health Maintenance Recommendations Screening Testing Mammogram Every 1  -2 years based on history and risk factors Starting at age 93 Pap Smear Ages 21-39 every 3 years Ages 55-65 every 5 years with HPV testing More frequent testing may be required based on results and history Colon Cancer Screening Every 1-10 years based on test performed, risk factors, and history Starting at age 5 Bone Density Screening Every 2-10 years based on history Starting at age 87 for women Recommendations for men differ based on medication usage, history, and risk factors AAA Screening One time ultrasound Men 52-35 years old who have every smoked Lung Cancer Screening Low Dose Lung CT every 12 months Age 84-80 years with a 30 pack-year smoking history who still smoke or who have quit within the last 15  years   Screening Labs Routine  Labs: Complete Blood Count (CBC), Complete Metabolic Panel (CMP), Cholesterol (Lipid Panel) Every 6-12 months based on history and medications May be recommended more frequently based on current conditions or previous results Hemoglobin A1c Lab Every 3-12 months based on history and previous results Starting at age 64 or earlier with diagnosis of diabetes, high cholesterol, BMI >26, and/or risk factors Frequent monitoring for patients with diabetes to ensure blood sugar control Thyroid  Panel (TSH) Every 6 months based on history, symptoms, and risk factors May be repeated more often if on medication HIV One time testing for all patients 11 and older May be repeated more frequently for patients with increased risk factors or exposure Hepatitis C One time testing for all patients 60 and older May be repeated more frequently for patients with increased risk factors or exposure Gonorrhea, Chlamydia Every 12 months for all sexually active persons 13-24 years Additional monitoring may be recommended for those who are considered high risk or who have symptoms Every 12 months for any woman on birth control, regardless of sexual  activity PSA Men 31-32 years old with risk factors Additional screening may be recommended from age 31-69 based on risk factors, symptoms, and history  Vaccine Recommendations Tetanus Booster All adults every 10 years Flu Vaccine All patients 6 months and older every year COVID Vaccine All patients 12 years and older Initial dosing with booster May recommend additional booster based on age and health history HPV Vaccine 2 doses all patients age 80-26 Dosing may be considered for patients over 26 Shingles Vaccine (Shingrix) 2 doses all adults 55 years and older Pneumonia (Pneumovax 23) All adults 65 years and older May recommend earlier dosing based on health history One year apart from Prevnar 13 Pneumonia (Prevnar 50) All adults 65 years and older Dosed 1 year after Pneumovax 23 Pneumonia (Prevnar 20) One time alternative to the two dosing of 13 and 23 For all adults with initial dose of 23, 20 is recommended 1 year later For all adults with initial dose of 13, 23 is still recommended as second option 1 year later  Additional Screening, Testing, and Vaccinations may be recommended on an individualized basis based on family history, health history, risk factors, and/or exposure.

## 2024-01-24 ENCOUNTER — Encounter: Payer: Self-pay | Admitting: Nurse Practitioner

## 2024-01-24 ENCOUNTER — Other Ambulatory Visit (HOSPITAL_BASED_OUTPATIENT_CLINIC_OR_DEPARTMENT_OTHER): Payer: Self-pay

## 2024-01-24 ENCOUNTER — Ambulatory Visit (INDEPENDENT_AMBULATORY_CARE_PROVIDER_SITE_OTHER): Payer: Commercial Managed Care - PPO | Admitting: Nurse Practitioner

## 2024-01-24 VITALS — BP 124/80 | HR 52 | Ht 70.0 in | Wt 191.0 lb

## 2024-01-24 DIAGNOSIS — I10 Essential (primary) hypertension: Secondary | ICD-10-CM

## 2024-01-24 DIAGNOSIS — Z130 Encounter for screening for diseases of the blood and blood-forming organs and certain disorders involving the immune mechanism: Secondary | ICD-10-CM

## 2024-01-24 DIAGNOSIS — E785 Hyperlipidemia, unspecified: Secondary | ICD-10-CM

## 2024-01-24 DIAGNOSIS — F5221 Male erectile disorder: Secondary | ICD-10-CM | POA: Diagnosis not present

## 2024-01-24 DIAGNOSIS — Z1329 Encounter for screening for other suspected endocrine disorder: Secondary | ICD-10-CM | POA: Diagnosis not present

## 2024-01-24 DIAGNOSIS — F411 Generalized anxiety disorder: Secondary | ICD-10-CM | POA: Diagnosis not present

## 2024-01-24 DIAGNOSIS — Z13228 Encounter for screening for other metabolic disorders: Secondary | ICD-10-CM | POA: Diagnosis not present

## 2024-01-24 DIAGNOSIS — Z23 Encounter for immunization: Secondary | ICD-10-CM

## 2024-01-24 DIAGNOSIS — Z8669 Personal history of other diseases of the nervous system and sense organs: Secondary | ICD-10-CM | POA: Diagnosis not present

## 2024-01-24 DIAGNOSIS — J302 Other seasonal allergic rhinitis: Secondary | ICD-10-CM | POA: Diagnosis not present

## 2024-01-24 DIAGNOSIS — Z1321 Encounter for screening for nutritional disorder: Secondary | ICD-10-CM | POA: Diagnosis not present

## 2024-01-24 DIAGNOSIS — R112 Nausea with vomiting, unspecified: Secondary | ICD-10-CM | POA: Diagnosis not present

## 2024-01-24 DIAGNOSIS — Z0000 Encounter for general adult medical examination without abnormal findings: Secondary | ICD-10-CM | POA: Diagnosis not present

## 2024-01-24 MED ORDER — VENLAFAXINE HCL ER 37.5 MG PO CP24
ORAL_CAPSULE | ORAL | 2 refills | Status: DC
  Filled 2024-01-24: qty 60, 30d supply, fill #0
  Filled 2024-02-18: qty 30, 15d supply, fill #1
  Filled 2024-02-18: qty 60, 30d supply, fill #1

## 2024-01-24 MED ORDER — ONDANSETRON 8 MG PO TBDP
8.0000 mg | ORAL_TABLET | Freq: Three times a day (TID) | ORAL | 3 refills | Status: AC | PRN
  Filled 2024-01-24: qty 20, 7d supply, fill #0
  Filled 2024-03-23: qty 20, 7d supply, fill #1

## 2024-01-24 MED ORDER — LEVOCETIRIZINE DIHYDROCHLORIDE 5 MG PO TABS
5.0000 mg | ORAL_TABLET | Freq: Every evening | ORAL | 3 refills | Status: AC
  Filled 2024-01-24 – 2024-03-25 (×2): qty 90, 90d supply, fill #0

## 2024-01-24 NOTE — Patient Instructions (Signed)
 For all adult patients, I recommend A well balanced diet low in saturated fats, cholesterol, and moderation in carbohydrates.   This can be as simple as monitoring portion sizes and cutting back on sugary beverages such as soda and juice to start with.    Daily water consumption of at least 64 ounces.  Physical activity at least 180 minutes per week, if just starting out.   This can be as simple as taking the stairs instead of the elevator and walking 2-3 laps around the office  purposefully every day.   STD protection, partner selection, and regular testing if high risk.  Limited consumption of alcoholic beverages if alcohol is consumed.  For women, I recommend no more than 7 alcoholic beverages per week, spread out throughout the week.  Avoid binge drinking or consuming large quantities of alcohol in one setting.   Please let me know if you feel you may need help with reduction or quitting alcohol consumption.   Avoidance of nicotine, if used.  Please let me know if you feel you may need help with reduction or quitting nicotine use.   Daily mental health attention.  This can be in the form of 5 minute daily meditation, prayer, journaling, yoga, reflection, etc.   Purposeful attention to your emotions and mental state can significantly improve your overall wellbeing  and  Health.  Please know that I am here to help you with all of your health care goals and am happy to work with you to find a solution that works best for you.  The greatest advice I have received with any changes in life are to take it one step at a time, that even means if all you can focus on is the next 60 seconds, then do that and celebrate your victories.  With any changes in life, you will have set backs, and that is OK. The important thing to remember is, if you have a set back, it is not a failure, it is an opportunity to try again!  Health Maintenance Recommendations Screening Testing Mammogram Every 1 -2  years based on history and risk factors Starting at age 15 Pap Smear Ages 21-39 every 3 years Ages 9-65 every 5 years with HPV testing More frequent testing may be required based on results and history Colon Cancer Screening Every 1-10 years based on test performed, risk factors, and history Starting at age 56 Bone Density Screening Every 2-10 years based on history Starting at age 55 for women Recommendations for men differ based on medication usage, history, and risk factors AAA Screening One time ultrasound Men 102-69 years old who have every smoked Lung Cancer Screening Low Dose Lung CT every 12 months Age 76-80 years with a 30 pack-year smoking history who still smoke or who have quit within the last 15 years  Screening Labs Routine  Labs: Complete Blood Count (CBC), Complete Metabolic Panel (CMP), Cholesterol (Lipid Panel) Every 6-12 months based on history and medications May be recommended more frequently based on current conditions or previous results Hemoglobin A1c Lab Every 3-12 months based on history and previous results Starting at age 11 or earlier with diagnosis of diabetes, high cholesterol, BMI >26, and/or risk factors Frequent monitoring for patients with diabetes to ensure blood sugar control Thyroid Panel (TSH w/ T3 & T4) Every 6 months based on history, symptoms, and risk factors May be repeated more often if on medication HIV One time testing for all patients 85 and older May be  repeated more frequently for patients with increased risk factors or exposure Hepatitis C One time testing for all patients 18 and older May be repeated more frequently for patients with increased risk factors or exposure Gonorrhea, Chlamydia Every 12 months for all sexually active persons 13-24 years Additional monitoring may be recommended for those who are considered high risk or who have symptoms PSA Men 60-66 years old with risk factors Additional screening may be  recommended from age 30-69 based on risk factors, symptoms, and history  Vaccine Recommendations Tetanus Booster All adults every 10 years Flu Vaccine All patients 6 months and older every year COVID Vaccine All patients 12 years and older Initial dosing with booster May recommend additional booster based on age and health history HPV Vaccine 2 doses all patients age 29-26 Dosing may be considered for patients over 26 Shingles Vaccine (Shingrix) 2 doses all adults 55 years and older Pneumonia (Pneumovax 23) All adults 65 years and older May recommend earlier dosing based on health history Pneumonia (Prevnar 12) All adults 65 years and older Dosed 1 year after Pneumovax 23  Additional Screening, Testing, and Vaccinations may be recommended on an individualized basis based on family history, health history, risk factors, and/or exposure.

## 2024-01-25 LAB — CBC WITH DIFFERENTIAL/PLATELET
Basophils Absolute: 0.1 x10E3/uL (ref 0.0–0.2)
Basos: 1 %
EOS (ABSOLUTE): 0.3 x10E3/uL (ref 0.0–0.4)
Eos: 3 %
Hematocrit: 48 % (ref 37.5–51.0)
Hemoglobin: 15.7 g/dL (ref 13.0–17.7)
Immature Grans (Abs): 0 x10E3/uL (ref 0.0–0.1)
Immature Granulocytes: 0 %
Lymphocytes Absolute: 2.7 x10E3/uL (ref 0.7–3.1)
Lymphs: 31 %
MCH: 30.6 pg (ref 26.6–33.0)
MCHC: 32.7 g/dL (ref 31.5–35.7)
MCV: 94 fL (ref 79–97)
Monocytes Absolute: 0.6 x10E3/uL (ref 0.1–0.9)
Monocytes: 7 %
Neutrophils Absolute: 5.2 x10E3/uL (ref 1.4–7.0)
Neutrophils: 58 %
Platelets: 282 x10E3/uL (ref 150–450)
RBC: 5.13 x10E6/uL (ref 4.14–5.80)
RDW: 12.3 % (ref 11.6–15.4)
WBC: 8.9 x10E3/uL (ref 3.4–10.8)

## 2024-01-25 LAB — CMP14+EGFR
ALT: 23 IU/L (ref 0–44)
AST: 23 IU/L (ref 0–40)
Albumin: 4.9 g/dL (ref 4.1–5.1)
Alkaline Phosphatase: 39 IU/L — ABNORMAL LOW (ref 47–123)
BUN/Creatinine Ratio: 15 (ref 9–20)
BUN: 15 mg/dL (ref 6–20)
Bilirubin Total: 0.7 mg/dL (ref 0.0–1.2)
CO2: 23 mmol/L (ref 20–29)
Calcium: 9.7 mg/dL (ref 8.7–10.2)
Chloride: 100 mmol/L (ref 96–106)
Creatinine, Ser: 0.98 mg/dL (ref 0.76–1.27)
Globulin, Total: 2.1 g/dL (ref 1.5–4.5)
Glucose: 85 mg/dL (ref 70–99)
Potassium: 4 mmol/L (ref 3.5–5.2)
Sodium: 139 mmol/L (ref 134–144)
Total Protein: 7 g/dL (ref 6.0–8.5)
eGFR: 103 mL/min/1.73 (ref >59)

## 2024-01-25 LAB — LIPID PANEL
Chol/HDL Ratio: 2.6 ratio (ref 0.0–5.0)
Cholesterol, Total: 154 mg/dL (ref 100–199)
HDL: 59 mg/dL (ref >39)
LDL Chol Calc (NIH): 83 mg/dL (ref 0–99)
Triglycerides: 59 mg/dL (ref 0–149)
VLDL Cholesterol Cal: 12 mg/dL (ref 5–40)

## 2024-01-25 LAB — HEMOGLOBIN A1C
Est. average glucose Bld gHb Est-mCnc: 103 mg/dL
Hgb A1c MFr Bld: 5.2 % (ref 4.8–5.6)

## 2024-01-30 ENCOUNTER — Ambulatory Visit: Payer: Self-pay | Admitting: Nurse Practitioner

## 2024-01-31 ENCOUNTER — Encounter: Payer: Self-pay | Admitting: Family Medicine

## 2024-01-31 ENCOUNTER — Ambulatory Visit: Admitting: Family Medicine

## 2024-01-31 VITALS — BP 127/75 | HR 52 | Ht 70.0 in | Wt 184.0 lb

## 2024-01-31 DIAGNOSIS — Z8639 Personal history of other endocrine, nutritional and metabolic disease: Secondary | ICD-10-CM

## 2024-01-31 DIAGNOSIS — F411 Generalized anxiety disorder: Secondary | ICD-10-CM

## 2024-01-31 DIAGNOSIS — I951 Orthostatic hypotension: Secondary | ICD-10-CM

## 2024-01-31 DIAGNOSIS — Z6826 Body mass index (BMI) 26.0-26.9, adult: Secondary | ICD-10-CM

## 2024-01-31 NOTE — Assessment & Plan Note (Signed)
 Generalized anxiety disorder with symptoms of anxiety and chest heaviness. Buspar  has been intermittently effective but causes sleepiness. He is interested in switching to an SNRI, specifically Effexor , for symptom management. Therapy is considered as an adjunct for anxiety and psychosocial issues. - Prescribe Effexor  with a tapering plan from Buspar . - Discuss therapy options for anxiety management. - Monitor for side effects and effectiveness of Effexor .

## 2024-01-31 NOTE — Assessment & Plan Note (Signed)
 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 Anxiety exacerbated by anticipatory stress. Reports nocturnal erections and no issues when not anxious. Therapy considered for underlying anxiety and performance concerns. - Discuss therapy options to address performance anxiety. - Continue current management and monitor for changes.

## 2024-01-31 NOTE — Assessment & Plan Note (Signed)
 Essential hypertension is well-controlled with current medication regimen. Episode of orthostatic hypotension possibly related to dehydration and rapid position change. - Advise on hydration and gradual position changes to prevent orthostatic hypotension.

## 2024-01-31 NOTE — Assessment & Plan Note (Signed)
 No further concerns present since weight loss. Not currently utilizing CPAP. Will continue to monitor closely and make changes as necessary based on symptoms and apneic episodes/snoring.

## 2024-01-31 NOTE — Assessment & Plan Note (Signed)
Patient has had significant weight loss with improved diet and exercise regimen. We will monitor labs today to ensure lipids are under control. At this time he is not on medication therapy. We can consider this if his lipids remain elevated.

## 2024-01-31 NOTE — Assessment & Plan Note (Signed)
Chronic. Currently well controlled with xyzal. Exam benign today with no concerns. Will refill medication and continue to monitor.

## 2024-01-31 NOTE — Assessment & Plan Note (Signed)

## 2024-01-31 NOTE — Progress Notes (Signed)
 Office: [phone]  /  Fax: [phone]  WEIGHT SUMMARY AND BIOMETRICS  Starting Date: 11/24/21  Starting Weight: 256lb   Weight Lost Since Last Visit: 3lb   Vitals BP: 127/75 Pulse Rate: (!) 52 SpO2: 98 %   Body Composition  Body Fat %: 19.9 % Fat Mass (lbs): 36.6 lbs Muscle Mass (lbs): 140.2 lbs Total Body Water (lbs): 101.8 lbs Visceral Fat Rating : 7     HPI  Chief Complaint: OBESITY  Jay Ortiz is here to discuss his progress with his obesity treatment plan. He is on the keeping a food journal and adhering to recommended goals of 2200 calories and 120 protein and states he is following his eating plan approximately 80 % of the time. He states he is exercising 60 minutes 3 times per week.  Interval History:  Since last office visit he is down 3 lb He did maintain his lean body mass This is a net weight loss of 72 lb in 2+ years of medically supervised weight management This is a 27% TBW loss without use of anti obesity medications He had vacation since last visit He is running 5 miles 3 days/ wk He is sleeping well at night He tracks calories most of the day staying ~2000 kcal/ day M-F He struggles some with social eating/ drinking  Pharmacotherapy: none  PHYSICAL EXAM:  Blood pressure 127/75, pulse (!) 52, height 5' 10 (1.778 m), weight 184 lb (83.5 kg), SpO2 98%. Body mass index is 26.4 kg/m.  General: He is healthy appearing,  cooperative, alert, well developed, and in no acute distress. PSYCH: Has normal mood, affect and thought process.   Lungs: Normal breathing effort, no conversational dyspnea.  ASSESSMENT AND PLAN  TREATMENT PLAN FOR OBESITY:  Recommended Dietary Goals  Reiss is currently in the action stage of change. As such, his goal is to continue weight management plan. He has agreed to keeping a food journal and adhering to recommended goals of 2000-2500 calories and 100-120 g of  protein.  Behavioral Intervention  We discussed  the following Behavioral Modification Strategies today: increasing lean protein intake to established goals, increasing fiber rich foods, increasing water intake , keeping healthy foods at home, and planning for success.  Additional resources provided today: NA  Recommended Physical Activity Goals  Rudie has been advised to work up to 150 minutes of moderate intensity aerobic activity a week and strengthening exercises 2-3 times per week for cardiovascular health, weight loss maintenance and preservation of muscle mass.   He has agreed to [employer] strengthening exercises with a goal of 2-3 sessions a week  Add in weight training 15 min 3 days/ wk Reduce running from 5--> 3 miles 3-4 days/ wk  Pharmacotherapy changes for the treatment of obesity: none  ASSOCIATED CONDITIONS ADDRESSED TODAY  History of obesity Much improved, down 72 lb with dietary change and regular exercise Logging kcal intake has helped him to maintain weight loss Continue to weigh each morning Congratulated him on his success.  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  BMI 26.0-26.9,adult Improved Will add in weight training for additional toning/ improving lean body mass   Orthostasis He reports an episode of orthostatic hypotension when waking to void at night  Labs reviewed from his PCP and VSS are stable He is working on improving water intake Recommend a sugar free electrolyte drink daily and to get up slowly from supine position  Generalized anxiety disorder He will be weaning off Buspar  due to fatigue and  started Effexor  with his PCP He has a good support system at home and is practicing mindful eating     He was informed of the importance of frequent follow up visits to maximize his success with intensive lifestyle modifications for his multiple health conditions.   ATTESTASTION STATEMENTS:  Reviewed by clinician on day of visit: allergies, medications, problem list, medical history, surgical history,  family history, social history, and previous encounter notes pertinent to obesity diagnosis.   I have personally spent 30 minutes total time today in preparation, patient care, nutritional counseling and education,  and documentation for this visit, including the following: review of most recent clinical lab tests,  reviewing medical assistant documentation, review and interpretation of bioimpedence results.     Darice Haddock, D.O. DABFM, DABOM [facility] [address] [place], [place] [address] [phone]

## 2024-02-17 ENCOUNTER — Other Ambulatory Visit: Payer: Self-pay

## 2024-02-17 ENCOUNTER — Encounter: Payer: Self-pay | Admitting: Nurse Practitioner

## 2024-02-17 DIAGNOSIS — F411 Generalized anxiety disorder: Secondary | ICD-10-CM

## 2024-02-18 ENCOUNTER — Other Ambulatory Visit (HOSPITAL_BASED_OUTPATIENT_CLINIC_OR_DEPARTMENT_OTHER): Payer: Self-pay

## 2024-03-06 ENCOUNTER — Other Ambulatory Visit (HOSPITAL_BASED_OUTPATIENT_CLINIC_OR_DEPARTMENT_OTHER): Payer: Self-pay

## 2024-03-06 DIAGNOSIS — H5203 Hypermetropia, bilateral: Secondary | ICD-10-CM | POA: Diagnosis not present

## 2024-03-06 MED ORDER — SODIUM FLUORIDE 5000 PPM 1.1 % DT PSTE
PASTE | DENTAL | 3 refills | Status: AC
  Filled 2024-03-06: qty 100, 25d supply, fill #0
  Filled 2024-04-11: qty 100, 25d supply, fill #1

## 2024-03-13 ENCOUNTER — Encounter: Payer: Self-pay | Admitting: Nurse Practitioner

## 2024-03-13 DIAGNOSIS — F411 Generalized anxiety disorder: Secondary | ICD-10-CM

## 2024-03-15 ENCOUNTER — Other Ambulatory Visit: Payer: Self-pay | Admitting: Nurse Practitioner

## 2024-03-15 ENCOUNTER — Other Ambulatory Visit (HOSPITAL_BASED_OUTPATIENT_CLINIC_OR_DEPARTMENT_OTHER): Payer: Self-pay

## 2024-03-15 MED ORDER — FLUOXETINE HCL 10 MG PO CAPS
10.0000 mg | ORAL_CAPSULE | Freq: Every day | ORAL | 2 refills | Status: DC
  Filled 2024-03-15: qty 30, 30d supply, fill #0
  Filled 2024-04-11: qty 30, 30d supply, fill #1
  Filled 2024-05-10: qty 30, 30d supply, fill #2

## 2024-03-23 ENCOUNTER — Other Ambulatory Visit (HOSPITAL_BASED_OUTPATIENT_CLINIC_OR_DEPARTMENT_OTHER): Payer: Self-pay | Admitting: Nurse Practitioner

## 2024-03-23 ENCOUNTER — Other Ambulatory Visit: Payer: Self-pay

## 2024-03-23 DIAGNOSIS — F411 Generalized anxiety disorder: Secondary | ICD-10-CM

## 2024-03-23 NOTE — Telephone Encounter (Signed)
 Last appt 01/24/24

## 2024-03-26 ENCOUNTER — Other Ambulatory Visit (HOSPITAL_BASED_OUTPATIENT_CLINIC_OR_DEPARTMENT_OTHER): Payer: Self-pay

## 2024-03-26 MED ORDER — HYDROXYZINE PAMOATE 25 MG PO CAPS
25.0000 mg | ORAL_CAPSULE | Freq: Two times a day (BID) | ORAL | 6 refills | Status: AC | PRN
  Filled 2024-03-26: qty 60, 30d supply, fill #0

## 2024-04-03 ENCOUNTER — Encounter: Payer: Self-pay | Admitting: Family Medicine

## 2024-04-03 ENCOUNTER — Ambulatory Visit: Admitting: Family Medicine

## 2024-04-03 VITALS — BP 119/72 | HR 53 | Ht 70.0 in | Wt 190.0 lb

## 2024-04-03 DIAGNOSIS — F411 Generalized anxiety disorder: Secondary | ICD-10-CM

## 2024-04-03 DIAGNOSIS — E785 Hyperlipidemia, unspecified: Secondary | ICD-10-CM | POA: Diagnosis not present

## 2024-04-03 DIAGNOSIS — Z6827 Body mass index (BMI) 27.0-27.9, adult: Secondary | ICD-10-CM | POA: Diagnosis not present

## 2024-04-03 DIAGNOSIS — E663 Overweight: Secondary | ICD-10-CM

## 2024-04-03 NOTE — Progress Notes (Signed)
 Office: [phone]  /  Fax: [phone]  WEIGHT SUMMARY AND BIOMETRICS  Starting Date: 11/24/21  Starting Weight: 256lb   Weight Lost Since Last Visit: 0lb   Vitals BP: 119/72 Pulse Rate: (!) 53 SpO2: 100 %   Body Composition  Body Fat %: 21.9 % Fat Mass (lbs): 41.8 lbs Muscle Mass (lbs): 141.4 lbs Total Body Water (lbs): 101.4 lbs Visceral Fat Rating : 8     HPI  Chief Complaint: OBESITY  Jay Ortiz is here to discuss his progress with his obesity treatment plan. He is on the the Category 3 Plan and states he is following his eating plan approximately 30 % of the time. He states he is exercising 60 minutes 1 times per week.   Interval History:  Since last office visit he is up 6 lb He has been been running or weight lifting, just starting back up in the past week Has resumed tracking calories He has been baking more and eating more bread He did increase muscle mass by 1.2 lb and is up 4.2 lb of body fat in the past 2 mos He plans to resume running and weight training from home Starting therapy for anxiety which has affected his motivation over the past 2 mos He has a net weight loss of 66 lb in 2 years of medically supervised weight management This is a 25.7% TBW loss  Pharmacotherapy: none  PHYSICAL EXAM:  Blood pressure 119/72, pulse (!) 53, height 5' 10 (1.778 m), weight 190 lb (86.2 kg), SpO2 100%. Body mass index is 27.26 kg/m.  General: He is healthy appearing,  cooperative, alert, well developed, and in no acute distress. PSYCH: Has normal mood, affect and thought process.   Lungs: Normal breathing effort, no conversational dyspnea.   ASSESSMENT AND PLAN  TREATMENT PLAN FOR OBESITY:  Recommended Dietary Goals  Erlin is currently in the action stage of change. As such, his goal is to continue weight management plan. He has agreed to keeping a food journal and adhering to recommended goals of 2000-2500 calories and 90+ g of  protein.  Behavioral Intervention  We discussed the following Behavioral Modification Strategies today: increasing lean protein intake to established goals, increasing fiber rich foods, work on tracking and journaling calories using tracking application, keeping healthy foods at home, identifying sources and decreasing liquid calories, continue to practice mindfulness when eating, continue to work on maintaining a reduced calorie state, getting the recommended amount of protein, incorporating whole foods, making healthy choices, staying well hydrated and practicing mindfulness when eating., and increase protein intake, fibrous foods (25 grams per day for women, 30 grams for men) and water to improve satiety and decrease hunger signals. .  Additional resources provided today: NA  Recommended Physical Activity Goals  Brendon has been advised to work up to 150 minutes of moderate intensity aerobic activity a week and strengthening exercises 2-3 times per week for cardiovascular health, weight loss maintenance and preservation of muscle mass.   He has agreed to Increase the intensity, frequency or duration of strengthening exercises  and Increase the intensity, frequency or duration of aerobic exercises   Aim for 30 min of treadmill workouts 5 days/ wk + kettlebell workouts 20 min 2-3 days/ wk  Pharmacotherapy changes for the treatment of obesity: none  ASSOCIATED CONDITIONS ADDRESSED TODAY  Generalized anxiety disorder Recently changed medication with PCP and plans to start counseling this month Continue to work on mental health as this affects eating behavior and motivation to exercise  Overweight (BMI 25.0-29.9) Fairly stable Will resume plan of care for calorie tracking, regular workouts (cardio and weight training), AM weigh- ins at home 2 days/ wk for accountability, improving food choices with lean protein and fiber at meals.  Aim for a stable goal weight ~180 lb.  BMI  27.0-27.9,adult  Dyslipidemia Improved LDL has greatly improved over the past 2 years of medically supervised weight management.     He was informed of the importance of frequent follow up visits to maximize his success with intensive lifestyle modifications for his multiple health conditions.   ATTESTASTION STATEMENTS:  Reviewed by clinician on day of visit: allergies, medications, problem list, medical history, surgical history, family history, social history, and previous encounter notes pertinent to obesity diagnosis.      Darice Haddock, D.O. DABFM, DABOM [facility] [address] [place], [place] [address] [phone]

## 2024-04-11 ENCOUNTER — Other Ambulatory Visit: Payer: Self-pay

## 2024-04-11 ENCOUNTER — Other Ambulatory Visit (HOSPITAL_BASED_OUTPATIENT_CLINIC_OR_DEPARTMENT_OTHER): Payer: Self-pay

## 2024-04-16 DIAGNOSIS — F4322 Adjustment disorder with anxiety: Secondary | ICD-10-CM | POA: Diagnosis not present

## 2024-05-16 DIAGNOSIS — E663 Overweight: Secondary | ICD-10-CM

## 2024-05-16 DIAGNOSIS — E785 Hyperlipidemia, unspecified: Secondary | ICD-10-CM

## 2024-05-17 ENCOUNTER — Other Ambulatory Visit (HOSPITAL_BASED_OUTPATIENT_CLINIC_OR_DEPARTMENT_OTHER): Payer: Self-pay

## 2024-05-17 MED ORDER — PHENTERMINE HCL 8 MG PO TABS
ORAL_TABLET | ORAL | 0 refills | Status: AC
  Filled 2024-05-17: qty 60, 30d supply, fill #0

## 2024-06-05 ENCOUNTER — Ambulatory Visit: Admitting: Family Medicine

## 2024-06-06 ENCOUNTER — Encounter: Payer: Self-pay | Admitting: Nurse Practitioner

## 2024-06-06 DIAGNOSIS — F5221 Male erectile disorder: Secondary | ICD-10-CM

## 2024-06-06 DIAGNOSIS — F411 Generalized anxiety disorder: Secondary | ICD-10-CM

## 2024-06-07 ENCOUNTER — Encounter (HOSPITAL_BASED_OUTPATIENT_CLINIC_OR_DEPARTMENT_OTHER): Payer: Self-pay

## 2024-06-07 ENCOUNTER — Other Ambulatory Visit (HOSPITAL_BASED_OUTPATIENT_CLINIC_OR_DEPARTMENT_OTHER): Payer: Self-pay

## 2024-06-07 MED ORDER — FLUOXETINE HCL 10 MG PO CAPS
10.0000 mg | ORAL_CAPSULE | Freq: Every day | ORAL | 3 refills | Status: AC
  Filled 2024-06-07 (×2): qty 90, 90d supply, fill #0

## 2024-06-07 MED ORDER — TADALAFIL 10 MG PO TABS
10.0000 mg | ORAL_TABLET | ORAL | 11 refills | Status: AC | PRN
  Filled 2024-06-07: qty 20, 40d supply, fill #0
  Filled 2024-06-07: qty 20, 90d supply, fill #0

## 2024-06-07 NOTE — Addendum Note (Signed)
 Addended by: ORIS DANKER E on: 06/07/2024 06:24 PM   Modules accepted: Orders

## 2024-06-08 ENCOUNTER — Other Ambulatory Visit: Payer: Self-pay

## 2024-06-08 ENCOUNTER — Other Ambulatory Visit (HOSPITAL_COMMUNITY): Payer: Self-pay

## 2024-07-03 ENCOUNTER — Ambulatory Visit: Admitting: Family Medicine

## 2025-01-29 ENCOUNTER — Encounter: Payer: Self-pay | Admitting: Nurse Practitioner
